# Patient Record
Sex: Female | Born: 1965 | Marital: Married | Smoking: Never smoker
Health system: Southern US, Community
[De-identification: ages and names within clinical notes are randomized; demographics above are authoritative.]

## PROBLEM LIST (undated history)

## (undated) DIAGNOSIS — M05742 Rheumatoid arthritis with rheumatoid factor of left hand without organ or systems involvement: Secondary | ICD-10-CM

## (undated) DIAGNOSIS — M1990 Unspecified osteoarthritis, unspecified site: Secondary | ICD-10-CM

## (undated) DIAGNOSIS — M05741 Rheumatoid arthritis with rheumatoid factor of right hand without organ or systems involvement: Secondary | ICD-10-CM

## (undated) DIAGNOSIS — E079 Disorder of thyroid, unspecified: Secondary | ICD-10-CM

## (undated) HISTORY — DX: Disorder of thyroid, unspecified: E07.9

## (undated) HISTORY — DX: Unspecified osteoarthritis, unspecified site: M19.90

## (undated) HISTORY — PX: NO PAST SURGERIES: SHX2092

## (undated) HISTORY — DX: Rheumatoid arthritis with rheumatoid factor of right hand without organ or systems involvement: M05.742

## (undated) HISTORY — DX: Rheumatoid arthritis with rheumatoid factor of right hand without organ or systems involvement: M05.741

---

## 2013-12-14 ENCOUNTER — Ambulatory Visit: Payer: Self-pay | Admitting: Family Medicine

## 2015-01-03 ENCOUNTER — Ambulatory Visit: Payer: Self-pay | Admitting: Family Medicine

## 2016-04-24 ENCOUNTER — Other Ambulatory Visit: Payer: Self-pay | Admitting: Internal Medicine

## 2016-04-24 DIAGNOSIS — Z1239 Encounter for other screening for malignant neoplasm of breast: Secondary | ICD-10-CM

## 2016-05-16 ENCOUNTER — Ambulatory Visit: Payer: Self-pay

## 2017-09-19 DIAGNOSIS — M25569 Pain in unspecified knee: Secondary | ICD-10-CM | POA: Insufficient documentation

## 2017-09-19 DIAGNOSIS — M25562 Pain in left knee: Secondary | ICD-10-CM | POA: Insufficient documentation

## 2017-12-09 ENCOUNTER — Other Ambulatory Visit: Payer: Self-pay | Admitting: Rheumatology

## 2017-12-09 DIAGNOSIS — M25561 Pain in right knee: Secondary | ICD-10-CM

## 2017-12-14 ENCOUNTER — Other Ambulatory Visit: Payer: Self-pay | Admitting: Rheumatology

## 2017-12-14 DIAGNOSIS — M25561 Pain in right knee: Principal | ICD-10-CM

## 2017-12-14 DIAGNOSIS — G8929 Other chronic pain: Secondary | ICD-10-CM

## 2017-12-16 ENCOUNTER — Ambulatory Visit: Payer: Self-pay

## 2017-12-16 ENCOUNTER — Other Ambulatory Visit: Payer: Self-pay

## 2017-12-23 ENCOUNTER — Ambulatory Visit
Admission: RE | Admit: 2017-12-23 | Discharge: 2017-12-23 | Disposition: A | Discharge status: Home / Self Care | Payer: Self-pay | Source: Ambulatory Visit | Attending: Rheumatology | Admitting: Rheumatology

## 2017-12-23 ENCOUNTER — Other Ambulatory Visit: Payer: Self-pay

## 2017-12-23 DIAGNOSIS — G8929 Other chronic pain: Secondary | ICD-10-CM

## 2017-12-23 DIAGNOSIS — M25561 Pain in right knee: Principal | ICD-10-CM

## 2018-01-06 ENCOUNTER — Other Ambulatory Visit
Admission: RE | Admit: 2018-01-06 | Discharge: 2018-01-06 | Disposition: A | Discharge status: Home / Self Care | Payer: BLUE CROSS/BLUE SHIELD | Source: Ambulatory Visit | Attending: Internal Medicine | Admitting: Internal Medicine

## 2018-01-06 DIAGNOSIS — M25561 Pain in right knee: Secondary | ICD-10-CM | POA: Diagnosis present

## 2018-01-06 DIAGNOSIS — M1711 Unilateral primary osteoarthritis, right knee: Secondary | ICD-10-CM | POA: Insufficient documentation

## 2018-01-06 LAB — SYNOVIAL CELL COUNT + DIFF, W/ CRYSTALS
Crystals, Fluid: NONE SEEN
EOSINOPHILS-SYNOVIAL: 0 %
Lymphocytes-Synovial Fld: 53 %
MONOCYTE-MACROPHAGE-SYNOVIAL FLUID: 45 %
Neutrophil, Synovial: 2 %
WBC, SYNOVIAL: 42 /mm3 (ref 0–200)

## 2018-01-10 LAB — BODY FLUID CULTURE
Culture: NO GROWTH
GRAM STAIN: NONE SEEN

## 2018-06-23 ENCOUNTER — Encounter (INDEPENDENT_AMBULATORY_CARE_PROVIDER_SITE_OTHER): Payer: Self-pay | Admitting: Vascular Surgery

## 2018-06-23 ENCOUNTER — Ambulatory Visit (INDEPENDENT_AMBULATORY_CARE_PROVIDER_SITE_OTHER): Payer: BLUE CROSS/BLUE SHIELD | Admitting: Vascular Surgery

## 2018-06-23 VITALS — BP 139/84 | HR 70 | Resp 17 | Ht 67.0 in | Wt 173.2 lb

## 2018-06-23 DIAGNOSIS — I83813 Varicose veins of bilateral lower extremities with pain: Secondary | ICD-10-CM

## 2018-06-23 DIAGNOSIS — R600 Localized edema: Secondary | ICD-10-CM | POA: Diagnosis not present

## 2018-06-23 NOTE — Progress Notes (Signed)
Subjective:    Patient ID: Melissa Baker, female    DOB: [birth year], 52 y.o.   MRN: [id number] Chief Complaint  Patient presents with  . New Patient (Initial Visit)    ref for varicose veins   Presents as a new patient referred by Dr. Jefm Bryant for evaluation of painful varicose veins.  The patient notes a long-standing history of painful varicosities located along the bilateral legs.  The patient notes that the discomfort along her varicosities worsen towards the end of the day or with sitting and standing for long periods of time.  The patient also experiences edema to the lower extremity.  This also worsens towards the end of the day with sitting standing for long periods of time.  At this time, the patient does not engage in conservative therapy including wearing medical grade 1 compression socks, elevating her legs and remaining active on a daily basis.  The patient feels that her symptoms have progressed to the point that she is unable to function on a daily basis and they have become lifestyle limiting.  The patient denies any recent surgery or trauma, DVT or recent bouts of cellulitis to the bilateral legs.  Patient denies any claudication-like symptoms, rest pain or ulcer formation to the bilateral lower extremity.  The patient denies any fever, nausea vomiting.  Review of Systems  Constitutional: Negative.   HENT: Negative.   Eyes: Negative.   Respiratory: Negative.   Cardiovascular: Positive for leg swelling.       Painful varicose veins  Gastrointestinal: Negative.   Endocrine: Negative.   Genitourinary: Negative.   Musculoskeletal: Negative.   Skin: Negative.   Allergic/Immunologic: Negative.   Neurological: Negative.   Hematological: Negative.   Psychiatric/Behavioral: Negative.       Objective:   Physical Exam  Constitutional: She is oriented to person, place, and time. She appears well-developed and well-nourished. No distress.  HENT:  Head: Normocephalic and  atraumatic.  Right Ear: External ear normal.  Left Ear: External ear normal.  Eyes: Pupils are equal, round, and reactive to light. Conjunctivae and EOM are normal.  Neck: Normal range of motion.  Cardiovascular: Normal rate, regular rhythm, normal heart sounds and intact distal pulses.  Pulses:      Radial pulses are 2+ on the right side, and 2+ on the left side.       Dorsalis pedis pulses are 2+ on the right side, and 2+ on the left side.       Posterior tibial pulses are 2+ on the right side, and 2+ on the left side.  Pulmonary/Chest: Effort normal and breath sounds normal.  Musculoskeletal: Normal range of motion. She exhibits edema (Mild 1+ pitting edema bilaterally).  Neurological: She is alert and oriented to person, place, and time.  Skin: She is not diaphoretic.  Less than 1 cm diffuse varicosities noted to the bilateral lower extremity.  There is no stasis dermatitis, fibrosis, cellulitis or active ulcerations noted at this time  Psychiatric: She has a normal mood and affect. Her behavior is normal. Judgment and thought content normal.  Vitals reviewed.  BP 139/84 (BP Location: Right Arm)   Pulse 70   Resp 17   Ht 5\' 7"  (1.702 m)   Wt 173 lb 3.2 oz (78.6 kg)   BMI 27.13 kg/m   Past Medical History:  Diagnosis Date  . Arthritis   . Thyroid disease    Social History   Socioeconomic History  . Marital status: Married  Spouse name: Not on file  . Number of children: Not on file  . Years of education: Not on file  . Highest education level: Not on file  Occupational History  . Not on file  Social Needs  . Financial resource strain: Not on file  . Food insecurity:    Worry: Not on file    Inability: Not on file  . Transportation needs:    Medical: Not on file    Non-medical: Not on file  Tobacco Use  . Smoking status: Never Smoker  . Smokeless tobacco: Never Used  Substance and Sexual Activity  . Alcohol use: Never    Frequency: Never  . Drug use: Never    . Sexual activity: Not on file  Lifestyle  . Physical activity:    Days per week: Not on file    Minutes per session: Not on file  . Stress: Not on file  Relationships  . Social connections:    Talks on phone: Not on file    Gets together: Not on file    Attends religious service: Not on file    Active member of club or organization: Not on file    Attends meetings of clubs or organizations: Not on file    Relationship status: Not on file  . Intimate partner violence:    Fear of current or ex partner: Not on file    Emotionally abused: Not on file    Physically abused: Not on file    Forced sexual activity: Not on file  Other Topics Concern  . Not on file  Social History Narrative  . Not on file   Family History  Problem Relation Age of Onset  . Varicose Veins Mother   . Heart failure Father       Assessment & Plan:  Presents as a new patient referred by Dr. Jefm Bryant for evaluation of painful varicose veins.  The patient notes a long-standing history of painful varicosities located along the bilateral legs.  The patient notes that the discomfort along her varicosities worsen towards the end of the day or with sitting and standing for long periods of time.  The patient also experiences edema to the lower extremity.  This also worsens towards the end of the day with sitting standing for long periods of time.  At this time, the patient does not engage in conservative therapy including wearing medical grade 1 compression socks, elevating her legs and remaining active on a daily basis.  The patient feels that her symptoms have progressed to the point that she is unable to function on a daily basis and they have become lifestyle limiting.  The patient denies any recent surgery or trauma, DVT or recent bouts of cellulitis to the bilateral legs.  Patient denies any claudication-like symptoms, rest pain or ulcer formation to the bilateral lower extremity.  The patient denies any fever, nausea  vomiting.  1. Bilateral lower extremity edema - New The patient was encouraged to wear graduated compression stockings (20-30 mmHg) on a daily basis. The patient was instructed to begin wearing the stockings first thing in the morning and removing them in the evening. The patient was instructed specifically not to sleep in the stockings. Prescription given.  In addition, behavioral modification including elevation during the day will be initiated. Anti-inflammatories for pain. I will bring the patient back in 3 months and have her undergo a bilateral lower extremity venous duplex to rule out any contributing venous versus lymphatic disease The  patient will follow up in three months to asses conservative management.  Information on chronic venous insufficiency and compression stockings was given to the patient. The patient was instructed to call the office in the interim if any worsening edema or ulcerations to the legs, feet or toes occurs. The patient expresses their understanding.  - VAS [place] LOWER EXTREMITY VENOUS REFLUX; Future  2. Varicose veins of both lower extremities with pain - New As above  - VAS [place] LOWER EXTREMITY VENOUS REFLUX; Future  Current Outpatient Medications on File Prior to Visit  Medication Sig Dispense Refill  . celecoxib (CELEBREX) 200 MG capsule Take by mouth.    . diclofenac sodium (VOLTAREN) 1 % GEL Apply topically.    Marland Kitchen levothyroxine (SYNTHROID, LEVOTHROID) 100 MCG tablet Take by mouth.    . methotrexate (RHEUMATREX) 2.5 MG tablet TAKE 8 TABLETS BY MOUTH EVERY 7 DAYS     No current facility-administered medications on file prior to visit.    There are no Patient Instructions on file for this visit. No follow-ups on file.  Mistina Coatney A Arliss Frisina, PA-C

## 2018-07-12 ENCOUNTER — Other Ambulatory Visit: Payer: Self-pay | Admitting: Internal Medicine

## 2018-07-12 DIAGNOSIS — Z1231 Encounter for screening mammogram for malignant neoplasm of breast: Secondary | ICD-10-CM

## 2018-09-23 ENCOUNTER — Encounter (INDEPENDENT_AMBULATORY_CARE_PROVIDER_SITE_OTHER): Payer: Self-pay | Admitting: Vascular Surgery

## 2018-09-23 ENCOUNTER — Ambulatory Visit (INDEPENDENT_AMBULATORY_CARE_PROVIDER_SITE_OTHER): Payer: BLUE CROSS/BLUE SHIELD | Admitting: Vascular Surgery

## 2018-09-23 ENCOUNTER — Ambulatory Visit (INDEPENDENT_AMBULATORY_CARE_PROVIDER_SITE_OTHER): Payer: BLUE CROSS/BLUE SHIELD

## 2018-09-23 VITALS — BP 137/89 | HR 60 | Resp 17 | Ht 67.0 in | Wt 173.0 lb

## 2018-09-23 DIAGNOSIS — I83813 Varicose veins of bilateral lower extremities with pain: Secondary | ICD-10-CM

## 2018-09-23 DIAGNOSIS — R600 Localized edema: Secondary | ICD-10-CM

## 2018-09-23 DIAGNOSIS — E039 Hypothyroidism, unspecified: Secondary | ICD-10-CM

## 2018-09-26 ENCOUNTER — Encounter (INDEPENDENT_AMBULATORY_CARE_PROVIDER_SITE_OTHER): Payer: Self-pay | Admitting: Vascular Surgery

## 2018-09-26 DIAGNOSIS — E039 Hypothyroidism, unspecified: Secondary | ICD-10-CM | POA: Insufficient documentation

## 2018-09-26 NOTE — Progress Notes (Signed)
MRN : [id number]  Melissa Baker is a 52 y.o. (06-28-1966) female who presents with chief complaint of  Chief Complaint  Patient presents with  . Follow-up    3 month bil venous reflux  .  History of Present Illness: The patient returns for followup evaluation 3 months after the initial visit. The patient continues to have pain in the lower extremities with dependency. The pain is lessened significantly with elevation. Graduated compression stockings, Class I (20-30 mmHg), have been worn and she notes the stockings almost completely eliminate the leg pain.  The degree of discomfort continues to interfere with daily activities. The patient notes the pain in the legs is causing problems with daily exercise, at the workplace and even with household activities and maintenance such as standing in the kitchen preparing meals and doing dishes.   Venous ultrasound shows normal deep venous system, no evidence of acute or chronic DVT.  Significant superficial reflux is not present.  Current Meds  Medication Sig  . celecoxib (CELEBREX) 200 MG capsule Take by mouth.  . diclofenac sodium (VOLTAREN) 1 % GEL Apply topically.  Marland Kitchen levothyroxine (SYNTHROID, LEVOTHROID) 100 MCG tablet Take by mouth.  . methotrexate (RHEUMATREX) 2.5 MG tablet TAKE 8 TABLETS BY MOUTH EVERY 7 DAYS    Past Medical History:  Diagnosis Date  . Arthritis   . Thyroid disease     Past Surgical History:  Procedure Laterality Date  . NO PAST SURGERIES      Social History Social History   Tobacco Use  . Smoking status: Never Smoker  . Smokeless tobacco: Never Used  Substance Use Topics  . Alcohol use: Never    Frequency: Never  . Drug use: Never    Family History Family History  Problem Relation Age of Onset  . Varicose Veins Mother   . Heart failure Father     No Known Allergies   REVIEW OF SYSTEMS (Negative unless checked)  Constitutional: [] Weight loss  [] Fever  [] Chills Cardiac: [] Chest pain    [] Chest pressure   [] Palpitations   [] Shortness of breath when laying flat   [] Shortness of breath with exertion. Vascular:  [] Pain in legs with walking   [x] Pain in legs at rest  [] History of DVT   [] Phlebitis   [x] Swelling in legs   [x] Varicose veins   [] Non-healing ulcers Pulmonary:   [] Uses home oxygen   [] Productive cough   [] Hemoptysis   [] Wheeze  [] COPD   [] Asthma Neurologic:  [] Dizziness   [] Seizures   [] History of stroke   [] History of TIA  [] Aphasia   [] Vissual changes   [] Weakness or numbness in arm   [] Weakness or numbness in leg Musculoskeletal:   [] Joint swelling   [] Joint pain   [] Low back pain Hematologic:  [] Easy bruising  [] Easy bleeding   [] Hypercoagulable state   [] Anemic Gastrointestinal:  [] Diarrhea   [] Vomiting  [] Gastroesophageal reflux/heartburn   [] Difficulty swallowing. Genitourinary:  [] Chronic kidney disease   [] Difficult urination  [] Frequent urination   [] Blood in urine Skin:  [] Rashes   [] Ulcers  Psychological:  [] History of anxiety   []  History of major depression.  Physical Examination  Vitals:   09/23/18 1450  BP: 137/89  Pulse: 60  Resp: 17  Weight: 173 lb (78.5 kg)  Height: 5\' 7"  (1.702 m)   Body mass index is 27.1 kg/m. Gen: WD/WN, NAD Head: [place]/AT, No temporalis wasting.  Ear/Nose/Throat: Hearing grossly intact, nares w/o erythema or drainage Eyes: PER, EOMI, sclera nonicteric.  Neck: Supple,  no large masses.   Pulmonary:  Good air movement, no audible wheezing bilaterally, no use of accessory muscles.  Cardiac: RRR, no JVD Vascular: scattered varicosities present bilaterally.  Mild venous stasis changes to the legs bilaterally.  2+ soft pitting edema Vessel Right Left  Radial Palpable Palpable  Gastrointestinal: Non-distended. No guarding/no peritoneal signs.  Musculoskeletal: M/S 5/5 throughout.  No deformity or atrophy.  Neurologic: CN 2-12 intact. Symmetrical.  Speech is fluent. Motor exam as listed above. Psychiatric: Judgment intact,  Mood & affect appropriate for pt's clinical situation. Dermatologic: very mild venous rashes no ulcers noted.  No changes consistent with cellulitis. Lymph : No lichenification or skin changes of chronic lymphedema.  CBC No results found for: WBC, HGB, HCT, MCV, PLT  BMET No results found for: NA, K, CL, CO2, GLUCOSE, BUN, CREATININE, CALCIUM, GFRNONAA, GFRAA CrCl cannot be calculated (No successful lab value found.).  COAG No results found for: INR, PROTIME  Radiology No results found.  Assessment/Plan 1. Varicose veins of both lower extremities with pain Recommend:  The patient is complaining of varicose veins.    I have had a long discussion with the patient regarding  varicose veins and why they cause symptoms.  Patient will begin wearing graduated compression stockings on a daily basis, beginning first thing in the morning and removing them in the evening. The patient is instructed specifically not to sleep in the stockings.    The patient  will also begin using over-the-counter analgesics such as Motrin 600 mg po TID to help control the symptoms as needed.    In addition, behavioral modification including elevation during the day will be initiated, utilizing a recliner was recommended.  The patient is also instructed to continue exercising such as walking 4-5 times per week.  At this time the patient wishes to continue conservative therapy and is not interested in more invasive treatments such as laser ablation and sclerotherapy.  The Patient will follow up PRN if the symptoms worsen.  2. Bilateral lower extremity edema No surgery or intervention at this point in time.    I have had a long discussion with the patient regarding venous insufficiency and why it  causes symptoms. I have discussed with the patient the chronic skin changes that accompany venous insufficiency and the long term sequela such as infection and ulceration.  Patient will begin wearing graduated  compression stockings class 1 (20-30 mmHg) or compression wraps on a daily basis a prescription was given. The patient will put the stockings on first thing in the morning and removing them in the evening. The patient is instructed specifically not to sleep in the stockings.    In addition, behavioral modification including several periods of elevation of the lower extremities during the day will be continued. I have demonstrated that proper elevation is a position with the ankles at heart level.  The patient is instructed to begin routine exercise, especially walking on a daily basis   3. Acquired hypothyroidism Continue Thyroid hormone replacement as already ordered, these medications have been reviewed and there are no changes at this time.     Hortencia Pilar, MD  09/26/2018 1:47 PM

## 2019-02-18 ENCOUNTER — Telehealth: Payer: Self-pay

## 2019-02-18 NOTE — Telephone Encounter (Signed)
Copied from [place] [id number]. Topic: General - Inquiry >> Feb 18, 2019  1:47 PM Richardo Priest, NT wrote: Reason for CRM: Patient's husband is calling is requesting that Santiago Glad call back. States she was helping them upload the insurance to their charts. Call back is [phone] or [phone].

## 2019-02-21 NOTE — Telephone Encounter (Addendum)
Pt husband returning Nina's call. Please advise. [phone]

## 2019-02-22 ENCOUNTER — Telehealth: Payer: Self-pay

## 2019-02-22 NOTE — Telephone Encounter (Signed)
Gave information to Santiago Glad who will handle the insurance.  Melissa Baker,cma

## 2019-02-22 NOTE — Telephone Encounter (Signed)
Copied from [place] [id number]. Topic: Appointment Scheduling - Scheduling Inquiry for Clinic >> Feb 22, 2019 12:15 PM Rainey Pines A wrote: Reason for CRM: Patient would like to establish primary care provider. Patient would like a callback.

## 2019-04-07 ENCOUNTER — Telehealth: Payer: Self-pay | Admitting: Internal Medicine

## 2019-04-07 ENCOUNTER — Other Ambulatory Visit: Payer: Self-pay

## 2019-04-07 NOTE — Telephone Encounter (Signed)
Do not recall accepting this new pateitn.  Please remind me of the circumstances.  Move appt if necessary

## 2019-04-07 NOTE — Telephone Encounter (Signed)
Called for an appt reminder for Monday. Pt husband stated that they only wanted an In office visit  Please advise pt

## 2019-04-07 NOTE — Telephone Encounter (Signed)
Pt is scheduled for 04/11/2019 at 4pm for a new pt appt. Pt is only wanting to be seen in the office. Caryl Pina has explained to the pt that we will have to move his appt to a different day and time because we do not do in office appts at 3:30 or 4. Pt is still wanting to be seen in the office only.

## 2019-04-07 NOTE — Telephone Encounter (Signed)
Dr. Derrel Nip did not agree to accept as a new patient. He will need to be scheduled with a provider that is accepting patients.

## 2019-04-08 NOTE — Telephone Encounter (Signed)
Lorriane Shire called and spoke to pt

## 2019-04-11 ENCOUNTER — Ambulatory Visit: Payer: PRIVATE HEALTH INSURANCE | Admitting: Internal Medicine

## 2019-04-11 ENCOUNTER — Encounter: Payer: Self-pay | Admitting: Internal Medicine

## 2019-04-11 ENCOUNTER — Other Ambulatory Visit: Payer: Self-pay

## 2019-04-11 ENCOUNTER — Ambulatory Visit (INDEPENDENT_AMBULATORY_CARE_PROVIDER_SITE_OTHER): Payer: PRIVATE HEALTH INSURANCE | Admitting: Internal Medicine

## 2019-04-11 VITALS — BP 120/78 | HR 79 | Temp 98.8°F | Resp 15 | Ht 67.0 in | Wt 168.8 lb

## 2019-04-11 DIAGNOSIS — Z1239 Encounter for other screening for malignant neoplasm of breast: Secondary | ICD-10-CM

## 2019-04-11 DIAGNOSIS — M25571 Pain in right ankle and joints of right foot: Secondary | ICD-10-CM | POA: Insufficient documentation

## 2019-04-11 DIAGNOSIS — R5383 Other fatigue: Secondary | ICD-10-CM

## 2019-04-11 DIAGNOSIS — M25532 Pain in left wrist: Secondary | ICD-10-CM | POA: Diagnosis not present

## 2019-04-11 DIAGNOSIS — G8929 Other chronic pain: Secondary | ICD-10-CM | POA: Insufficient documentation

## 2019-04-11 DIAGNOSIS — Z79899 Other long term (current) drug therapy: Secondary | ICD-10-CM

## 2019-04-11 DIAGNOSIS — E039 Hypothyroidism, unspecified: Secondary | ICD-10-CM

## 2019-04-11 NOTE — Assessment & Plan Note (Signed)
She has localized swelling and pain since a twisting event in March.  She had x rays done by Dr, Marry Guan to rule out fracture.  Lateral ankle sprain recommend use of a support sock,  Ice , and PT  exercises to increase mobility

## 2019-04-11 NOTE — Patient Instructions (Addendum)
Continue meloxicam total daily dose 15 mg   You can add up to 2000 mg of acetominophen (tylenol) every day safely  In divided doses (500 mg every 6 hours  Or 1000 mg every 12 hours.)  Neurology referral for CTS evaluation regarding the left wrist  Your right ankle strain should improve with time ,  Exercise and support of the ligaments when you are standing.  I will try to find the brand of cloth brace that I used on mine and sent you the information   Mammogram will be ordered   Check with your insurance about the cologuard test as a covered screening test for colon cancer.   I'll see you again in 3 months for your annual exam. Please make a separate fasitng labs appointment

## 2019-04-11 NOTE — Progress Notes (Signed)
Subjective:  Patient ID: Melissa Baker, female    DOB: 1966-03-24  Age: 53 y.o. MRN: [id number]  CC: There were no encounter diagnoses.  HPI Melissa Baker presents for new patient evaluation   She was referred by Dr Duayne Cal, whom she is seeing for right knee pain secondary to a  meniscal tear .   No surgery planned. Her knee pain is slowly improving using a knee brace  .  COVID 19 pandmeic impact explored.  Her ancillary staff has been restricted,  she feels  Overwhelmed by the domestic chores that aggravate her rheumatoid arthritis pain.  She takes MTX weekly and feels overtly fatigued the following  day ,sometimes sleeps the entire next day . She is primary caregiver to her mother , who has been trapped In [place] since February when she travelled there to visit patient's sister and was restricted travel due to the epidemic.    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 Her pain was noted to improve after 24 hours of use      Improving after a few days of wearing the brace .  Housewife,    Last PAP was 2015.   Last mammogram 2012   needs female tech and radiologist only!  Right lateral ankle pain and localized swelling since March when she had a twisting event    Has RA managed with MTX  History Melissa Baker has a past medical history of Arthritis and Thyroid disease.   She has a past surgical history that includes No past surgeries.   Her family history includes Heart failure in her father; Varicose Veins in her mother.She reports that she has never smoked. She has never used smokeless tobacco. She reports that she does not drink alcohol or use drugs.  Outpatient Medications Prior to Visit  Medication Sig Dispense Refill  . levothyroxine (SYNTHROID, LEVOTHROID) 100 MCG tablet Take by mouth.    . methotrexate (RHEUMATREX) 2.5 MG tablet TAKE 8 TABLETS BY MOUTH EVERY 7 DAYS    . celecoxib (CELEBREX) 200 MG capsule Take by mouth.     No facility-administered medications prior to visit.     Review of Systems:  Patient denies headache, fevers, malaise, unintentional weight loss, skin rash, eye pain, sinus congestion and sinus pain, sore throat, dysphagia,  hemoptysis , cough, dyspnea, wheezing, chest pain, palpitations, orthopnea, edema, abdominal pain, nausea, melena, diarrhea, constipation, flank pain, dysuria, hematuria, urinary  Frequency, nocturia, numbness, tingling, seizures,  Focal weakness, Loss of consciousness,  Tremor, insomnia,  and suicidal ideation.     Objective:  BP 120/78 (BP Location: Left Arm, Patient Position: Sitting, Cuff Size: Normal)   Pulse 79   Temp 98.8 F (37.1 C) (Oral)   Resp 15   Ht 5\' 7"  (1.702 m)   Wt 168 lb 12.8 oz (76.6 kg)   SpO2 98%   BMI 26.44 kg/m   Physical Exam:  General appearance: alert, cooperative and appears stated age Ears: normal TM's and external ear canals both ears Throat: lips, mucosa, and tongue normal; teeth and gums normal Neck: no adenopathy, no carotid bruit, supple, symmetrical, trachea midline and thyroid not enlarged, symmetric, no tenderness/mass/nodules Back: symmetric, no curvature. ROM normal. No CVA tenderness. Lungs: clear to auscultation bilaterally Heart: regular rate and rhythm, S1, S2 normal, no murmur, click, rub or gallop Abdomen: soft, non-tender; bowel sounds normal; no masses,  no organomegaly Pulses: 2+ and symmetric Skin: Skin color,  texture, turgor normal. No rashes or lesions Lymph nodes: Cervical, supraclavicular, and axillary nodes normal.  Assessment & Plan:   Problem List Items Addressed This Visit    None      I have discontinued Melissa Baker's celecoxib. I am also having her maintain her levothyroxine and methotrexate.  No orders of the defined types were placed in this encounter.   Medications Discontinued During This Encounter  Medication Reason  . celecoxib (CELEBREX) 200 MG capsule Error    Follow-up: No  follow-ups on file.   Crecencio Mc, MD

## 2019-04-11 NOTE — Assessment & Plan Note (Addendum)
Pain and numbness improving with use of ankle brace. 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  Referring to neurology for EMG/Racine studies

## 2019-04-15 DIAGNOSIS — M25561 Pain in right knee: Secondary | ICD-10-CM

## 2019-04-26 ENCOUNTER — Encounter: Payer: Self-pay | Admitting: Internal Medicine

## 2019-05-10 ENCOUNTER — Telehealth: Payer: Self-pay | Admitting: Internal Medicine

## 2019-05-10 NOTE — Telephone Encounter (Signed)
Is it okay to schedule pt for a knee injection?

## 2019-05-10 NOTE — Telephone Encounter (Signed)
It is too soon .Marland Kitchen  She had one on  April 23 at [facility],  And is under the care of Dr Marry Guan .

## 2019-05-10 NOTE — Telephone Encounter (Signed)
Pt stated that she is having knee pain and is wanting to get an injection ASAP

## 2019-05-11 NOTE — Telephone Encounter (Signed)
Spoke with Melissa Baker and she stated that she does not want Dr. Marry Guan to give the injections any more. Melissa Baker stated that she spoke with you at her last visit and that you had agreed to start giving them to her. Melissa Baker wanted me to go ahead and schedule an appt but for another knee injection but I told the Melissa Baker that I could not right now because im not sure when she would be due for the next one and that I would need to follow up with but this.

## 2019-05-11 NOTE — Telephone Encounter (Signed)
[facility] docu,ented April 23.  So 4 month miniumum

## 2019-05-11 NOTE — Telephone Encounter (Signed)
Pt scheduled for 06/21/19

## 2019-05-11 NOTE — Telephone Encounter (Signed)
Patient returning [place] call. Says she thinks her last injection was in March and would like a call back to discuss.

## 2019-05-11 NOTE — Telephone Encounter (Signed)
LMTCB. Need to schedule pt for a knee injection sometime after 06/12/2019.

## 2019-06-21 ENCOUNTER — Ambulatory Visit: Payer: PRIVATE HEALTH INSURANCE | Admitting: Internal Medicine

## 2019-07-11 ENCOUNTER — Other Ambulatory Visit: Payer: Self-pay

## 2019-07-11 ENCOUNTER — Other Ambulatory Visit (INDEPENDENT_AMBULATORY_CARE_PROVIDER_SITE_OTHER): Payer: PRIVATE HEALTH INSURANCE

## 2019-07-11 DIAGNOSIS — E039 Hypothyroidism, unspecified: Secondary | ICD-10-CM

## 2019-07-11 DIAGNOSIS — Z79899 Other long term (current) drug therapy: Secondary | ICD-10-CM | POA: Diagnosis not present

## 2019-07-11 DIAGNOSIS — R5383 Other fatigue: Secondary | ICD-10-CM | POA: Diagnosis not present

## 2019-07-11 LAB — CBC WITH DIFFERENTIAL/PLATELET
Basophils Absolute: 0 10*3/uL (ref 0.0–0.1)
Basophils Relative: 0.4 % (ref 0.0–3.0)
Eosinophils Absolute: 0.1 10*3/uL (ref 0.0–0.7)
Eosinophils Relative: 0.9 % (ref 0.0–5.0)
HCT: 38.8 % (ref 36.0–46.0)
Hemoglobin: 12.8 g/dL (ref 12.0–15.0)
Lymphocytes Relative: 24.3 % (ref 12.0–46.0)
Lymphs Abs: 2 10*3/uL (ref 0.7–4.0)
MCHC: 33 g/dL (ref 30.0–36.0)
MCV: 86.2 fl (ref 78.0–100.0)
Monocytes Absolute: 0.4 10*3/uL (ref 0.1–1.0)
Monocytes Relative: 4.9 % (ref 3.0–12.0)
Neutro Abs: 5.7 10*3/uL (ref 1.4–7.7)
Neutrophils Relative %: 69.5 % (ref 43.0–77.0)
Platelets: 309 10*3/uL (ref 150.0–400.0)
RBC: 4.5 Mil/uL (ref 3.87–5.11)
RDW: 17.1 % — ABNORMAL HIGH (ref 11.5–15.5)
WBC: 8.2 10*3/uL (ref 4.0–10.5)

## 2019-07-11 LAB — COMPREHENSIVE METABOLIC PANEL
ALT: 14 U/L (ref 0–35)
AST: 15 U/L (ref 0–37)
Albumin: 4.2 g/dL (ref 3.5–5.2)
Alkaline Phosphatase: 72 U/L (ref 39–117)
BUN: 10 mg/dL (ref 6–23)
CO2: 26 mEq/L (ref 19–32)
Calcium: 9.7 mg/dL (ref 8.4–10.5)
Chloride: 102 mEq/L (ref 96–112)
Creatinine, Ser: 0.52 mg/dL (ref 0.40–1.20)
GFR: 122.37 mL/min (ref >60.00)
Glucose, Bld: 80 mg/dL (ref 70–99)
Potassium: 4.2 mEq/L (ref 3.5–5.1)
Sodium: 138 mEq/L (ref 135–145)
Total Bilirubin: 0.4 mg/dL (ref 0.2–1.2)
Total Protein: 6.9 g/dL (ref 6.0–8.3)

## 2019-07-11 LAB — LIPID PANEL
Cholesterol: 235 mg/dL — ABNORMAL HIGH (ref 0–200)
HDL: 50 mg/dL (ref >39.00)
LDL Cholesterol: 156 mg/dL — ABNORMAL HIGH (ref 0–99)
NonHDL: 185.09
Total CHOL/HDL Ratio: 5
Triglycerides: 143 mg/dL (ref 0.0–149.0)
VLDL: 28.6 mg/dL (ref 0.0–40.0)

## 2019-07-11 LAB — TSH: TSH: 2.03 u[IU]/mL (ref 0.35–4.50)

## 2019-07-13 ENCOUNTER — Ambulatory Visit (INDEPENDENT_AMBULATORY_CARE_PROVIDER_SITE_OTHER): Payer: PRIVATE HEALTH INSURANCE | Admitting: Internal Medicine

## 2019-07-13 ENCOUNTER — Other Ambulatory Visit: Payer: Self-pay

## 2019-07-13 ENCOUNTER — Other Ambulatory Visit (HOSPITAL_COMMUNITY)
Admission: RE | Admit: 2019-07-13 | Discharge: 2019-07-13 | Disposition: A | Discharge status: Home / Self Care | Payer: PRIVATE HEALTH INSURANCE | Source: Ambulatory Visit | Attending: Internal Medicine | Admitting: Internal Medicine

## 2019-07-13 ENCOUNTER — Encounter: Payer: Self-pay | Admitting: Internal Medicine

## 2019-07-13 VITALS — BP 118/72 | HR 75 | Temp 98.5°F | Resp 16 | Ht 67.0 in | Wt 169.0 lb

## 2019-07-13 DIAGNOSIS — G5603 Carpal tunnel syndrome, bilateral upper limbs: Secondary | ICD-10-CM

## 2019-07-13 DIAGNOSIS — M0579 Rheumatoid arthritis with rheumatoid factor of multiple sites without organ or systems involvement: Secondary | ICD-10-CM | POA: Diagnosis not present

## 2019-07-13 DIAGNOSIS — M069 Rheumatoid arthritis, unspecified: Secondary | ICD-10-CM | POA: Insufficient documentation

## 2019-07-13 DIAGNOSIS — Z1231 Encounter for screening mammogram for malignant neoplasm of breast: Secondary | ICD-10-CM

## 2019-07-13 DIAGNOSIS — Z0000 Encounter for general adult medical examination without abnormal findings: Secondary | ICD-10-CM | POA: Diagnosis not present

## 2019-07-13 DIAGNOSIS — Z124 Encounter for screening for malignant neoplasm of cervix: Secondary | ICD-10-CM | POA: Diagnosis not present

## 2019-07-13 DIAGNOSIS — Z23 Encounter for immunization: Secondary | ICD-10-CM | POA: Diagnosis not present

## 2019-07-13 DIAGNOSIS — E785 Hyperlipidemia, unspecified: Secondary | ICD-10-CM | POA: Diagnosis not present

## 2019-07-13 NOTE — Progress Notes (Signed)
Patient ID: Melissa Baker, female    DOB: June 13, 1966  Age: 53 y.o. MRN: [id number]  The patient is here for annual preventive examination and management of other chronic and acute problems.   The risk factors are reflected in the social history.  The roster of all physicians providing medical care to patient - is listed in the Snapshot section of the chart.  Activities of daily living:  The patient is 100% independent in all ADLs: dressing, toileting, feeding as well as independent mobility  Home safety : The patient has smoke detectors in the home. They wear seatbelts.  There are no firearms at home. There is no violence in the home.   There is no risks for hepatitis, STDs or HIV. There is no   history of blood transfusion. They have no travel history to infectious disease endemic areas of the world.  The patient has seen their dentist in the last six month. They have seen their eye doctor in the last year. .  They do not  have excessive sun exposure. Discussed the need for sun protection: hats, long sleeves and use of sunscreen if there is significant sun exposure.   Diet: the importance of a healthy diet is discussed. They do have a healthy diet.  The benefits of regular aerobic exercise were discussed. She has been unable to exercise due to ankle and knee pain from rheumatoid arthritis   Depression screen: there are no signs or vegative symptoms of depression- irritability, change in appetite, anhedonia, sadness/tearfullness.  The following portions of the patient's history were reviewed and updated as appropriate: allergies, current medications, past family history, past medical history,  past surgical history, past social history  and problem list.  Visual acuity was not assessed per patient preference since she has regular follow up with her ophthalmologist. Hearing and body mass index were assessed and reviewed.   During the course of the visit the patient was educated and counseled  about appropriate screening and preventive services including : fall prevention , diabetes screening, nutrition counseling, colorectal cancer screening, and recommended immunizations.    CC: The primary encounter diagnosis was Cervical cancer screening. Diagnoses of Need for immunization against influenza, Encounter for preventive health examination, Encounter for Papanicolaou smear for cervical cancer screening, Encounter for screening mammogram for breast cancer, Hyperlipidemia LDL goal <130, Carpal tunnel syndrome on both sides, and Rheumatoid arthritis involving multiple sites with positive rheumatoid factor ([place]) were also pertinent to this visit.  Since her initial visit in June she has seen Dr Jefm Bryant (Rheumatology) for knee pain and Dr Manuella Ghazi (Neurology)for bilateral hand pain   Her right knee effusion  was aspirated by Dr. Jefm Bryant ion August 13.  approx 30 ccs was removed and [place] 40 mg injected with good results .  A prednisone taper was prescribed and MTC was increased.  At 6 week follow up  She was advised to consider Humira for active inflammation  And advised to defer the carpal tunnel injection advised by Dr Manuella Ghazi (see below) .  She is asking for my advice   She was diagnosed with  CTS , given gabapentin and scheduled for a carpal tunnel injection in October   History Schuyler has a past medical history of Rheumatoid arthritis involving both hands with positive rheumatoid factor ([place]) and Thyroid disease.   She has a past surgical history that includes No past surgeries.   Her family history includes Arthritis in her mother; Heart failure in her father; Varicose Veins in her  mother.She reports that she has never smoked. She has never used smokeless tobacco. She reports that she does not drink alcohol or use drugs.  Outpatient Medications Prior to Visit  Medication Sig Dispense Refill  . folic acid (FOLVITE) 1 MG tablet Take 2 mg by mouth daily.    Marland Kitchen levothyroxine (SYNTHROID,  LEVOTHROID) 100 MCG tablet Take by mouth.    . meloxicam (MOBIC) 7.5 MG tablet Take 7.5 mg by mouth daily.    . methotrexate (RHEUMATREX) 2.5 MG tablet TAKE 8 TABLETS BY MOUTH EVERY 7 DAYS     No facility-administered medications prior to visit.     Review of Systems  Patient denies headache, fevers, malaise, unintentional weight loss, skin rash, eye pain, sinus congestion and sinus pain, sore throat, dysphagia,  hemoptysis , cough, dyspnea, wheezing, chest pain, palpitations, orthopnea, edema, abdominal pain, nausea, melena, diarrhea, constipation, flank pain, dysuria, hematuria, urinary  Frequency, nocturia, numbness, tingling, seizures,  Focal weakness, Loss of consciousness,  Tremor, insomnia, depression, anxiety, and suicidal ideation.     Objective:  BP 118/72   Pulse 75   Temp 98.5 F (36.9 C)   Resp 16   Ht '5\' 7"'$  (1.702 m)   Wt 169 lb (76.7 kg)   SpO2 98%   BMI 26.47 kg/m   Physical Exam   . General Appearance:    Alert, cooperative, no distress, appears stated age  Head:    Normocephalic, without obvious abnormality, atraumatic  Eyes:    PERRL, conjunctiva/corneas clear, EOM's intact, fundi    benign, both eyes  Ears:    Normal TM's and external ear canals, both ears  Nose:   Nares normal, septum midline, mucosa normal, no drainage    or sinus tenderness  Throat:   Lips, mucosa, and tongue normal; teeth and gums normal  Neck:   Supple, symmetrical, trachea midline, no adenopathy;    thyroid:  no enlargement/tenderness/nodules; no carotid   bruit or JVD  Back:     Symmetric, no curvature, ROM normal, no CVA tenderness  Lungs:     Clear to auscultation bilaterally, respirations unlabored  Chest Wall:    No tenderness or deformity   Heart:    Regular rate and rhythm, S1 and S2 normal, no murmur, rub   or gallop  Breast Exam:    No tenderness, masses, or nipple abnormality  Abdomen:     Soft, non-tender, bowel sounds active all four quadrants,    no masses, no  organomegaly  Genitalia:    Pelvic: cervix normal in appearance, external genitalia normal, no adnexal masses or tenderness, no cervical motion tenderness, rectovaginal septum normal, uterus normal size, shape, and consistency and vagina normal without discharge  Extremities:   Extremities normal, atraumatic, no cyanosis or edema  Pulses:   2+ and symmetric all extremities  Skin:   Skin color, texture, turgor normal, no rashes or lesions  Lymph nodes:   Cervical, supraclavicular, and axillary nodes normal  Neurologic:   CNII-XII intact, normal strength, sensation and reflexes    throughout      Assessment & Plan:   Problem List Items Addressed This Visit      Unprioritized   Encounter for preventive health examination    age appropriate education and counseling updated, referrals for preventative services and immunizations addressed, dietary and smoking counseling addressed, most recent labs reviewed.  I have personally reviewed and have noted:  1) the patient's medical and social history 2) The pt's use of alcohol, tobacco, and illicit  drugs 3) The patient's current medications and supplements 4) Functional ability including ADL's, fall risk, home safety risk, hearing and visual impairment 5) Diet and physical activities 6) Evidence for depression or mood disorder 7) The patient's height, weight, and BMI have been recorded in the chart  I have made referrals, and provided counseling and education based on review of the above      Encounter for Papanicolaou smear for cervical cancer screening   Encounter for screening mammogram for breast cancer    Breast exam done, mammogram has been ordered       Hyperlipidemia LDL goal <130    Using the Framingham risk calculator,  her 10 year risk of coronary artery disease is 5%.      Carpal tunnel syndrome on both sides    Agree with Dr Jefm Bryant re postponing CT injections until after humira has been added       Rheumatoid  arthritis ([place])    Persistent ESR elevation and synovitis on MTX with recent dose increased by Rheum.  Agree with Humira trial.  Hi dose flu vaccine given today        Other Visit Diagnoses    Cervical cancer screening    -  Primary   Relevant Orders   Cytology - PAP( [place])   Need for immunization against influenza       Relevant Orders   Flu Vaccine QUAD High Dose(Fluad) (Completed)      I am having Tonae Showers maintain her levothyroxine, methotrexate, folic acid, and meloxicam.  No orders of the defined types were placed in this encounter.   There are no discontinued medications.  Follow-up: No follow-ups on file.   Crecencio Mc, MD

## 2019-07-13 NOTE — Assessment & Plan Note (Signed)
Persistent ESR elevation and synovitis on MTX with recent dose increased by Rheum.  Agree with Humira trial.  Hi dose flu vaccine given today

## 2019-07-13 NOTE — Assessment & Plan Note (Signed)

## 2019-07-13 NOTE — Assessment & Plan Note (Signed)
Agree with Dr Jefm Bryant re postponing CT injections until after humira has been added

## 2019-07-13 NOTE — Assessment & Plan Note (Signed)
Using the Framingham risk calculator,  her 10 year risk of coronary artery disease is 5%.

## 2019-07-13 NOTE — Assessment & Plan Note (Signed)
Breast exam done, mammogram has been ordered

## 2019-07-13 NOTE — Patient Instructions (Signed)
I agree totally with Dr Jefm Bryant.  Defer the steroid injection  You received the high dose  flu vaccine today   Health Maintenance for Postmenopausal Women Menopause is a normal process in which your ability to get pregnant comes to an end. This process happens slowly over many months or years, usually between the ages of 69 and 109. Menopause is complete when you have missed your menstrual periods for 12 months. It is important to talk with your health care provider about some of the most common conditions that affect women after menopause (postmenopausal women). These include heart disease, cancer, and bone loss (osteoporosis). Adopting a healthy lifestyle and getting preventive care can help to promote your health and wellness. The actions you take can also lower your chances of developing some of these common conditions. What should I know about menopause? During menopause, you may get a number of symptoms, such as:  Hot flashes. These can be moderate or severe.  Night sweats.  Decrease in sex drive.  Mood swings.  Headaches.  Tiredness.  Irritability.  Memory problems.  Insomnia. Choosing to treat or not to treat these symptoms is a decision that you make with your health care provider. Do I need hormone replacement therapy?  Hormone replacement therapy is effective in treating symptoms that are caused by menopause, such as hot flashes and night sweats.  Hormone replacement carries certain risks, especially as you become older. If you are thinking about using estrogen or estrogen with progestin, discuss the benefits and risks with your health care provider. What is my risk for heart disease and stroke? The risk of heart disease, heart attack, and stroke increases as you age. One of the causes may be a change in the body's hormones during menopause. This can affect how your body uses dietary fats, triglycerides, and cholesterol. Heart attack and stroke are medical emergencies.  There are many things that you can do to help prevent heart disease and stroke. Watch your blood pressure  High blood pressure causes heart disease and increases the risk of stroke. This is more likely to develop in people who have high blood pressure readings, are of African descent, or are overweight.  Have your blood pressure checked: ? Every 3-5 years if you are 7-20 years of age. ? Every year if you are 38 years old or older. Eat a healthy diet   Eat a diet that includes plenty of vegetables, fruits, low-fat dairy products, and lean protein.  Do not eat a lot of foods that are high in solid fats, added sugars, or sodium. Get regular exercise Get regular exercise. This is one of the most important things you can do for your health. Most adults should:  Try to exercise for at least 150 minutes each week. The exercise should increase your heart rate and make you sweat (moderate-intensity exercise).  Try to do strengthening exercises at least twice each week. Do these in addition to the moderate-intensity exercise.  Spend less time sitting. Even light physical activity can be beneficial. Other tips  Work with your health care provider to achieve or maintain a healthy weight.  Do not use any products that contain nicotine or tobacco, such as cigarettes, e-cigarettes, and chewing tobacco. If you need help quitting, ask your health care provider.  Know your numbers. Ask your health care provider to check your cholesterol and your blood sugar (glucose). Continue to have your blood tested as directed by your health care provider. Do I need screening for  cancer? Depending on your health history and family history, you may need to have cancer screening at different stages of your life. This may include screening for:  Breast cancer.  Cervical cancer.  Lung cancer.  Colorectal cancer. What is my risk for osteoporosis? After menopause, you may be at increased risk for osteoporosis.  Osteoporosis is a condition in which bone destruction happens more quickly than new bone creation. To help prevent osteoporosis or the bone fractures that can happen because of osteoporosis, you may take the following actions:  If you are 34-68 years old, get at least 1,000 mg of calcium and at least 600 mg of vitamin D per day.  If you are older than age 24 but younger than age 44, get at least 1,200 mg of calcium and at least 600 mg of vitamin D per day.  If you are older than age 28, get at least 1,200 mg of calcium and at least 800 mg of vitamin D per day. Smoking and drinking excessive alcohol increase the risk of osteoporosis. Eat foods that are rich in calcium and vitamin D, and do weight-bearing exercises several times each week as directed by your health care provider. How does menopause affect my mental health? Depression may occur at any age, but it is more common as you become older. Common symptoms of depression include:  Low or sad mood.  Changes in sleep patterns.  Changes in appetite or eating patterns.  Feeling an overall lack of motivation or enjoyment of activities that you previously enjoyed.  Frequent crying spells. Talk with your health care provider if you think that you are experiencing depression. General instructions See your health care provider for regular wellness exams and vaccines. This may include:  Scheduling regular health, dental, and eye exams.  Getting and maintaining your vaccines. These include: ? Influenza vaccine. Get this vaccine each year before the flu season begins. ? Pneumonia vaccine. ? Shingles vaccine. ? Tetanus, diphtheria, and pertussis (Tdap) booster vaccine. Your health care provider may also recommend other immunizations. Tell your health care provider if you have ever been abused or do not feel safe at home. Summary  Menopause is a normal process in which your ability to get pregnant comes to an end.  This condition causes  hot flashes, night sweats, decreased interest in sex, mood swings, headaches, or lack of sleep.  Treatment for this condition may include hormone replacement therapy.  Take actions to keep yourself healthy, including exercising regularly, eating a healthy diet, watching your weight, and checking your blood pressure and blood sugar levels.  Get screened for cancer and depression. Make sure that you are up to date with all your vaccines. This information is not intended to replace advice given to you by your health care provider. Make sure you discuss any questions you have with your health care provider. Document Released: 11/28/2005 Document Revised: 09/29/2018 Document Reviewed: 09/29/2018 Elsevier Patient Education  2020 [employer].

## 2019-07-15 LAB — CYTOLOGY - PAP
Diagnosis: NEGATIVE
High risk HPV: NEGATIVE
Molecular Disclaimer: 56
Molecular Disclaimer: NORMAL

## 2020-01-18 ENCOUNTER — Other Ambulatory Visit: Payer: Self-pay | Admitting: Rheumatology

## 2020-01-18 DIAGNOSIS — G8929 Other chronic pain: Secondary | ICD-10-CM

## 2020-01-18 DIAGNOSIS — M25571 Pain in right ankle and joints of right foot: Secondary | ICD-10-CM

## 2020-01-26 ENCOUNTER — Ambulatory Visit
Admission: RE | Admit: 2020-01-26 | Discharge: 2020-01-26 | Disposition: A | Discharge status: Home / Self Care | Payer: PRIVATE HEALTH INSURANCE | Source: Ambulatory Visit | Attending: Rheumatology | Admitting: Rheumatology

## 2020-01-26 ENCOUNTER — Other Ambulatory Visit: Payer: Self-pay

## 2020-01-26 DIAGNOSIS — G8929 Other chronic pain: Secondary | ICD-10-CM | POA: Diagnosis present

## 2020-01-26 DIAGNOSIS — M25571 Pain in right ankle and joints of right foot: Secondary | ICD-10-CM | POA: Diagnosis not present

## 2020-03-27 ENCOUNTER — Telehealth: Payer: Self-pay | Admitting: Internal Medicine

## 2020-03-27 MED ORDER — LEVOTHYROXINE SODIUM 100 MCG PO TABS: 100.0000 ug | ORAL_TABLET | Freq: Every day | ORAL | 1 refills | Status: DC

## 2020-03-27 NOTE — Telephone Encounter (Signed)
Pt needs a refill on levothyroxine (SYNTHROID, LEVOTHROID) 100 MCG tablet sent to [facility]

## 2020-07-02 ENCOUNTER — Telehealth: Payer: Self-pay | Admitting: Internal Medicine

## 2020-07-02 DIAGNOSIS — Z0000 Encounter for general adult medical examination without abnormal findings: Secondary | ICD-10-CM

## 2020-07-02 DIAGNOSIS — E785 Hyperlipidemia, unspecified: Secondary | ICD-10-CM

## 2020-07-02 DIAGNOSIS — E039 Hypothyroidism, unspecified: Secondary | ICD-10-CM

## 2020-07-02 NOTE — Telephone Encounter (Signed)
Patient called in wanted to know if she needs to get labs before her physical scheduled on 07-23-20

## 2020-07-02 NOTE — Telephone Encounter (Signed)
Pt would like to have her labs done before her appt on 07/23/2020. I have ordered cbc, lipid panel, cmp, tsh, and a1c. Is there anything else that needs to be ordered?

## 2020-07-16 ENCOUNTER — Encounter: Payer: PRIVATE HEALTH INSURANCE | Admitting: Internal Medicine

## 2020-07-20 ENCOUNTER — Other Ambulatory Visit: Payer: Self-pay

## 2020-07-20 ENCOUNTER — Other Ambulatory Visit (INDEPENDENT_AMBULATORY_CARE_PROVIDER_SITE_OTHER): Payer: PRIVATE HEALTH INSURANCE

## 2020-07-20 DIAGNOSIS — Z0000 Encounter for general adult medical examination without abnormal findings: Secondary | ICD-10-CM | POA: Diagnosis not present

## 2020-07-20 DIAGNOSIS — E039 Hypothyroidism, unspecified: Secondary | ICD-10-CM | POA: Diagnosis not present

## 2020-07-20 DIAGNOSIS — E785 Hyperlipidemia, unspecified: Secondary | ICD-10-CM | POA: Diagnosis not present

## 2020-07-20 LAB — CBC WITH DIFFERENTIAL/PLATELET
Basophils Absolute: 0 10*3/uL (ref 0.0–0.1)
Basophils Relative: 0.8 % (ref 0.0–3.0)
Eosinophils Absolute: 0.1 10*3/uL (ref 0.0–0.7)
Eosinophils Relative: 2 % (ref 0.0–5.0)
HCT: 38.6 % (ref 36.0–46.0)
Hemoglobin: 13.1 g/dL (ref 12.0–15.0)
Lymphocytes Relative: 44.5 % (ref 12.0–46.0)
Lymphs Abs: 2.2 10*3/uL (ref 0.7–4.0)
MCHC: 33.8 g/dL (ref 30.0–36.0)
MCV: 90.3 fl (ref 78.0–100.0)
Monocytes Absolute: 0.3 10*3/uL (ref 0.1–1.0)
Monocytes Relative: 5.1 % (ref 3.0–12.0)
Neutro Abs: 2.3 10*3/uL (ref 1.4–7.7)
Neutrophils Relative %: 47.6 % (ref 43.0–77.0)
Platelets: 249 10*3/uL (ref 150.0–400.0)
RBC: 4.28 Mil/uL (ref 3.87–5.11)
RDW: 15.3 % (ref 11.5–15.5)
WBC: 4.9 10*3/uL (ref 4.0–10.5)

## 2020-07-20 LAB — COMPREHENSIVE METABOLIC PANEL
ALT: 15 U/L (ref 0–35)
AST: 14 U/L (ref 0–37)
Albumin: 4.4 g/dL (ref 3.5–5.2)
Alkaline Phosphatase: 52 U/L (ref 39–117)
BUN: 16 mg/dL (ref 6–23)
CO2: 28 mEq/L (ref 19–32)
Calcium: 9.6 mg/dL (ref 8.4–10.5)
Chloride: 105 mEq/L (ref 96–112)
Creatinine, Ser: 0.7 mg/dL (ref 0.40–1.20)
GFR: 86.51 mL/min (ref >60.00)
Glucose, Bld: 96 mg/dL (ref 70–99)
Potassium: 4.1 mEq/L (ref 3.5–5.1)
Sodium: 140 mEq/L (ref 135–145)
Total Bilirubin: 0.5 mg/dL (ref 0.2–1.2)
Total Protein: 6.7 g/dL (ref 6.0–8.3)

## 2020-07-20 LAB — LIPID PANEL
Cholesterol: 242 mg/dL — ABNORMAL HIGH (ref 0–200)
HDL: 50.8 mg/dL (ref >39.00)
LDL Cholesterol: 156 mg/dL — ABNORMAL HIGH (ref 0–99)
NonHDL: 191.32
Total CHOL/HDL Ratio: 5
Triglycerides: 177 mg/dL — ABNORMAL HIGH (ref 0.0–149.0)
VLDL: 35.4 mg/dL (ref 0.0–40.0)

## 2020-07-20 LAB — HEMOGLOBIN A1C: Hgb A1c MFr Bld: 5.5 % (ref 4.6–6.5)

## 2020-07-20 LAB — TSH: TSH: 1.11 u[IU]/mL (ref 0.35–4.50)

## 2020-07-23 ENCOUNTER — Ambulatory Visit (INDEPENDENT_AMBULATORY_CARE_PROVIDER_SITE_OTHER): Payer: PRIVATE HEALTH INSURANCE | Admitting: Internal Medicine

## 2020-07-23 ENCOUNTER — Telehealth: Payer: Self-pay | Admitting: Internal Medicine

## 2020-07-23 ENCOUNTER — Encounter: Payer: Self-pay | Admitting: Internal Medicine

## 2020-07-23 ENCOUNTER — Other Ambulatory Visit: Payer: Self-pay

## 2020-07-23 VITALS — BP 138/74 | HR 84 | Temp 98.2°F | Resp 15 | Ht 67.0 in | Wt 169.8 lb

## 2020-07-23 DIAGNOSIS — Z0000 Encounter for general adult medical examination without abnormal findings: Secondary | ICD-10-CM | POA: Diagnosis not present

## 2020-07-23 DIAGNOSIS — Z23 Encounter for immunization: Secondary | ICD-10-CM | POA: Diagnosis not present

## 2020-07-23 DIAGNOSIS — Z79899 Other long term (current) drug therapy: Secondary | ICD-10-CM

## 2020-07-23 DIAGNOSIS — Z1211 Encounter for screening for malignant neoplasm of colon: Secondary | ICD-10-CM | POA: Diagnosis not present

## 2020-07-23 DIAGNOSIS — E785 Hyperlipidemia, unspecified: Secondary | ICD-10-CM

## 2020-07-23 DIAGNOSIS — R5383 Other fatigue: Secondary | ICD-10-CM

## 2020-07-23 DIAGNOSIS — Z1231 Encounter for screening mammogram for malignant neoplasm of breast: Secondary | ICD-10-CM

## 2020-07-23 DIAGNOSIS — R7301 Impaired fasting glucose: Secondary | ICD-10-CM

## 2020-07-23 DIAGNOSIS — M0579 Rheumatoid arthritis with rheumatoid factor of multiple sites without organ or systems involvement: Secondary | ICD-10-CM

## 2020-07-23 DIAGNOSIS — E039 Hypothyroidism, unspecified: Secondary | ICD-10-CM

## 2020-07-23 NOTE — Assessment & Plan Note (Addendum)
Current risk is 8.5%  But has RA  Discussed risks s benefits of statin . Father died of MI at 41 .

## 2020-07-23 NOTE — Progress Notes (Signed)
Patient ID: Melissa Baker, female    DOB: [birth year]  Age: 54 y.o. MRN: [id number]  The patient is here for annual wellness examination and management of other chronic and acute problems.  This visit occurred during the SARS-CoV-2 public health emergency.  Safety protocols were in place, including screening questions prior to the visit, additional usage of staff PPE, and extensive cleaning of exam room while observing appropriate contact time as indicated for disinfecting solutions.     The risk factors are reflected in the social history.  The roster of all physicians providing medical care to patient - is listed in the Snapshot section of the chart.  Activities of daily living:  The patient is 100% independent in all ADLs: dressing, toileting, feeding as well as independent mobility  Home safety : The patient has smoke detectors in the home. They wear seatbelts.  There are no firearms at home. There is no violence in the home.   There is no risks for hepatitis, STDs or HIV. There is no   history of blood transfusion. They have no travel history to infectious disease endemic areas of the world.  The patient has seen their dentist in the last six month. They have seen their eye doctor in the last year. They admit to slight hearing difficulty with regard to whispered voices and some television programs.  They have deferred audiologic testing in the last year.  They do not  have excessive sun exposure. Discussed the need for sun protection: hats, long sleeves and use of sunscreen if there is significant sun exposure.   Diet: the importance of a healthy diet is discussed. They do have a healthy diet.  The benefits of regular aerobic exercise were discussed. She walks 4 times per week ,  20 minutes.   Depression screen: there are no signs or vegative symptoms of depression- irritability, change in appetite, anhedonia, sadness/tearfullness.  Cognitive assessment: the patient manages all their  financial and personal affairs and is actively engaged. They could relate day,date,year and events; recalled 2/3 objects at 3 minutes; performed clock-face test normally.  The following portions of the patient's history were reviewed and updated as appropriate: allergies, current medications, past family history, past medical history,  past surgical history, past social history  and problem list.  Visual acuity was not assessed per patient preference since she has regular follow up with her ophthalmologist. Hearing and body mass index were assessed and reviewed.   During the course of the visit the patient was educated and counseled about appropriate screening and preventive services including : fall prevention , diabetes screening, nutrition counseling, colorectal cancer screening, and recommended immunizations.    CC: The primary encounter diagnosis was Encounter for preventive health examination. Diagnoses of Need for immunization against influenza, Hyperlipidemia LDL goal <130, Colon cancer screening, Encounter for screening mammogram for malignant neoplasm of breast, Acquired hypothyroidism, and Rheumatoid arthritis involving multiple sites with positive rheumatoid factor ([place]) were also pertinent to this visit.   Had a very painful 2020 due to joint pain. [place] approved in April  And feels that it is helping with ankle pain as well  Has apt with Melissa Baker tomorrow   Going to [place]  for wedding.    Getting booster before thn  Needs refills on all meds.  Labs reiewed  FRC risk 8.5%   Defers colono ca screening with colonoscopy but agrees to use of cologuard    History Melissa Baker has a past medical history of Rheumatoid  arthritis involving both hands with positive rheumatoid factor (HCC) and Thyroid disease.   She has a past surgical history that includes No past surgeries.   Her family history includes Arthritis in her mother; Heart failure in her father; Varicose Veins in her  mother.She reports that she has never smoked. She has never used smokeless tobacco. She reports that she does not drink alcohol and does not use drugs.  Outpatient Medications Prior to Visit  Medication Sig Dispense Refill  . diclofenac Sodium (VOLTAREN) 1 % GEL Apply 2 g topically 2 (two) times daily.    . folic acid (FOLVITE) 1 MG tablet Take 2 mg by mouth daily.    Marland Kitchen HUMIRA PEN 40 MG/0.4ML PNKT SMARTSIG:40 Milligram(s) SUB-Q Every 2 Weeks    . levothyroxine (SYNTHROID) 100 MCG tablet Take 1 tablet (100 mcg total) by mouth daily before breakfast. 90 tablet 1  . meloxicam (MOBIC) 7.5 MG tablet Take 7.5 mg by mouth daily.    . methotrexate (RHEUMATREX) 2.5 MG tablet TAKE 8 TABLETS BY MOUTH EVERY 7 DAYS     No facility-administered medications prior to visit.    Review of Systems  Patient denies headache, fevers, malaise, unintentional weight loss, skin rash, eye pain, sinus congestion and sinus pain, sore throat, dysphagia,  hemoptysis , cough, dyspnea, wheezing, chest pain, palpitations, orthopnea, edema, abdominal pain, nausea, melena, diarrhea, constipation, flank pain, dysuria, hematuria, urinary  Frequency, nocturia, numbness, tingling, seizures,  Focal weakness, Loss of consciousness,  Tremor, insomnia, depression, anxiety, and suicidal ideation.     Objective:  BP 138/74 (BP Location: Left Arm, Patient Position: Sitting, Cuff Size: Normal)   Pulse 84   Temp 98.2 F (36.8 C) (Oral)   Resp 15   Ht 5\' 7"  (1.702 m)   Wt 169 lb 12.8 oz (77 kg)   SpO2 98%   BMI 26.59 kg/m   Physical Exam  General appearance: alert, cooperative and appears stated age Head: Normocephalic, without obvious abnormality, atraumatic Eyes: conjunctivae/corneas clear. PERRL, EOM's intact. Fundi benign. Ears: normal TM's and external ear canals both ears Nose: Nares normal. Septum midline. Mucosa normal. No drainage or sinus tenderness. Throat: lips, mucosa, and tongue normal; teeth and gums  normal Neck: no adenopathy, no carotid bruit, no JVD, supple, symmetrical, trachea midline and thyroid not enlarged, symmetric, no tenderness/mass/nodules Lungs: clear to auscultation bilaterally Breasts: normal appearance, no masses or tenderness Heart: regular rate and rhythm, S1, S2 normal, no murmur, click, rub or gallop Abdomen: soft, non-tender; bowel sounds normal; no masses,  no organomegaly Extremities: extremities normal, atraumatic, no cyanosis or edema Pulses: 2+ and symmetric Skin: Skin color, texture, turgor normal. No rashes or lesions Neurologic: Alert and oriented X 3, normal strength and tone. Normal symmetric reflexes. Normal coordination and gait.    Assessment & Plan:   Problem List Items Addressed This Visit      Unprioritized   Encounter for preventive health examination - Primary    age appropriate education and counseling updated, referrals for preventative services and immunizations addressed, dietary and smoking counseling addressed, most recent labs reviewed.  I have personally reviewed and have noted:  1) the patient's medical and social history 2) The pt's use of alcohol, tobacco, and illicit drugs 3) The patient's current medications and supplements 4) Functional ability including ADL's, fall risk, home safety risk, hearing and visual impairment 5) Diet and physical activities 6) Evidence for depression or mood disorder 7) The patient's height, weight, and BMI have been recorded in the chart  I have made referrals, and provided counseling and education based on review of the above      Hyperlipidemia LDL goal <130    Current risk is 8.5%  But has RA  Discussed risks s benefits of statin . Father died of MI at 76 .        Hypothyroidism    Thyroid function is WNL on current dose of 100 mcg daily .  No current changes needed.   Lab Results  Component Value Date   TSH 1.11 07/20/2020         Rheumatoid arthritis ([place])    Managed with  methotrexate, folic acid and Humira by Dr Melissa Baker.  surveillance labs are normal today   Lab Results  Component Value Date   WBC 4.9 07/20/2020   HGB 13.1 07/20/2020   HCT 38.6 07/20/2020   MCV 90.3 07/20/2020   PLT 249.0 07/20/2020   Lab Results  Component Value Date   ALT 15 07/20/2020   AST 14 07/20/2020   ALKPHOS 52 07/20/2020   BILITOT 0.5 07/20/2020         Relevant Medications   HUMIRA PEN 40 MG/0.4ML PNKT    Other Visit Diagnoses    Need for immunization against influenza       Relevant Orders   Flu Vaccine QUAD 36+ mos IM (Completed)   Colon cancer screening       Relevant Orders   Cologuard   Encounter for screening mammogram for malignant neoplasm of breast       Relevant Orders   MM 3D SCREEN BREAST BILATERAL      I am having Avri Reaser maintain her methotrexate, folic acid, meloxicam, levothyroxine, Humira Pen, and diclofenac Sodium.  No orders of the defined types were placed in this encounter.   There are no discontinued medications.  Follow-up: No follow-ups on file.   Crecencio Mc, MD

## 2020-07-23 NOTE — Patient Instructions (Signed)
Go and see your mother.  Do not let COVID prevent you from seeing her.   I have ordered the Cologuard test. This screening test for colon cancer is very accurate in patients who are at average risk for colon cancer, if  repeated  every 3 years, .  We can continue to use if until you are 84 for  colon CA screening, unless you develop a change in bowel function or a family history of colon Cancer.    Your annual mammogram has been ordered at [facility] Menopause is a normal process in which your ability to get pregnant comes to an end. This process happens slowly over many months or years, usually between the ages of 43 and 46. Menopause is complete when you have missed your menstrual periods for 12 months. It is important to talk with your health care provider about some of the most common conditions that affect women after menopause (postmenopausal women). These include heart disease, cancer, and bone loss (osteoporosis). Adopting a healthy lifestyle and getting preventive care can help to promote your health and wellness. The actions you take can also lower your chances of developing some of these common conditions. What should I know about menopause? During menopause, you may get a number of symptoms, such as:  Hot flashes. These can be moderate or severe.  Night sweats.  Decrease in sex drive.  Mood swings.  Headaches.  Tiredness.  Irritability.  Memory problems.  Insomnia. Choosing to treat or not to treat these symptoms is a decision that you make with your health care provider. Do I need hormone replacement therapy?  Hormone replacement therapy is effective in treating symptoms that are caused by menopause, such as hot flashes and night sweats.  Hormone replacement carries certain risks, especially as you become older. If you are thinking about using estrogen or estrogen with progestin, discuss the benefits and risks with your  health care provider. What is my risk for heart disease and stroke? The risk of heart disease, heart attack, and stroke increases as you age. One of the causes may be a change in the body's hormones during menopause. This can affect how your body uses dietary fats, triglycerides, and cholesterol. Heart attack and stroke are medical emergencies. There are many things that you can do to help prevent heart disease and stroke. Watch your blood pressure  High blood pressure causes heart disease and increases the risk of stroke. This is more likely to develop in people who have high blood pressure readings, are of African descent, or are overweight.  Have your blood pressure checked: ? Every 3-5 years if you are 81-32 years of age. ? Every year if you are 71 years old or older. Eat a healthy diet   Eat a diet that includes plenty of vegetables, fruits, low-fat dairy products, and lean protein.  Do not eat a lot of foods that are high in solid fats, added sugars, or sodium. Get regular exercise Get regular exercise. This is one of the most important things you can do for your health. Most adults should:  Try to exercise for at least 150 minutes each week. The exercise should increase your heart rate and make you sweat (moderate-intensity exercise).  Try to do strengthening exercises at least twice each week. Do these in addition to the moderate-intensity exercise.  Spend less time sitting. Even light physical activity can be beneficial. Other tips  Work with your health care  provider to achieve or maintain a healthy weight.  Do not use any products that contain nicotine or tobacco, such as cigarettes, e-cigarettes, and chewing tobacco. If you need help quitting, ask your health care provider.  Know your numbers. Ask your health care provider to check your cholesterol and your blood sugar (glucose). Continue to have your blood tested as directed by your health care provider. Do I need  screening for cancer? Depending on your health history and family history, you may need to have cancer screening at different stages of your life. This may include screening for:  Breast cancer.  Cervical cancer.  Lung cancer.  Colorectal cancer. What is my risk for osteoporosis? After menopause, you may be at increased risk for osteoporosis. Osteoporosis is a condition in which bone destruction happens more quickly than new bone creation. To help prevent osteoporosis or the bone fractures that can happen because of osteoporosis, you may take the following actions:  If you are 19-41 years old, get at least 1,000 mg of calcium and at least 600 mg of vitamin D per day.  If you are older than age 71 but younger than age 24, get at least 1,200 mg of calcium and at least 600 mg of vitamin D per day.  If you are older than age 60, get at least 1,200 mg of calcium and at least 800 mg of vitamin D per day. Smoking and drinking excessive alcohol increase the risk of osteoporosis. Eat foods that are rich in calcium and vitamin D, and do weight-bearing exercises several times each week as directed by your health care provider. How does menopause affect my mental health? Depression may occur at any age, but it is more common as you become older. Common symptoms of depression include:  Low or sad mood.  Changes in sleep patterns.  Changes in appetite or eating patterns.  Feeling an overall lack of motivation or enjoyment of activities that you previously enjoyed.  Frequent crying spells. Talk with your health care provider if you think that you are experiencing depression. General instructions See your health care provider for regular wellness exams and vaccines. This may include:  Scheduling regular health, dental, and eye exams.  Getting and maintaining your vaccines. These include: ? Influenza vaccine. Get this vaccine each year before the flu season begins. ? Pneumonia  vaccine. ? Shingles vaccine. ? Tetanus, diphtheria, and pertussis (Tdap) booster vaccine. Your health care provider may also recommend other immunizations. Tell your health care provider if you have ever been abused or do not feel safe at home. Summary  Menopause is a normal process in which your ability to get pregnant comes to an end.  This condition causes hot flashes, night sweats, decreased interest in sex, mood swings, headaches, or lack of sleep.  Treatment for this condition may include hormone replacement therapy.  Take actions to keep yourself healthy, including exercising regularly, eating a healthy diet, watching your weight, and checking your blood pressure and blood sugar levels.  Get screened for cancer and depression. Make sure that you are up to date with all your vaccines. This information is not intended to replace advice given to you by your health care provider. Make sure you discuss any questions you have with your health care provider. Document Revised: 09/29/2018 Document Reviewed: 09/29/2018 Elsevier Patient Education  2020 [employer].

## 2020-07-23 NOTE — Telephone Encounter (Signed)
Patient need fasting lab order put in for 07-19-2021

## 2020-07-24 ENCOUNTER — Telehealth: Payer: Self-pay

## 2020-07-24 NOTE — Telephone Encounter (Signed)
error 

## 2020-07-24 NOTE — Assessment & Plan Note (Signed)

## 2020-07-24 NOTE — Assessment & Plan Note (Signed)
Thyroid function is WNL on current dose of 100 mcg daily .  No current changes needed.   Lab Results  Component Value Date   TSH 1.11 07/20/2020

## 2020-07-24 NOTE — Assessment & Plan Note (Signed)
Managed with methotrexate, folic acid and Humira by Dr Jefm Bryant.  surveillance labs are normal today   Lab Results  Component Value Date   WBC 4.9 07/20/2020   HGB 13.1 07/20/2020   HCT 38.6 07/20/2020   MCV 90.3 07/20/2020   PLT 249.0 07/20/2020   Lab Results  Component Value Date   ALT 15 07/20/2020   AST 14 07/20/2020   ALKPHOS 52 07/20/2020   BILITOT 0.5 07/20/2020

## 2020-08-07 NOTE — Telephone Encounter (Signed)
Labs have been ordered

## 2020-10-16 ENCOUNTER — Other Ambulatory Visit: Payer: Self-pay | Admitting: Family

## 2021-04-20 ENCOUNTER — Other Ambulatory Visit: Payer: Self-pay | Admitting: Family

## 2021-04-21 ENCOUNTER — Other Ambulatory Visit: Payer: Self-pay | Admitting: Family Medicine

## 2021-04-21 DIAGNOSIS — E039 Hypothyroidism, unspecified: Secondary | ICD-10-CM

## 2021-04-21 MED ORDER — LEVOTHYROXINE SODIUM 100 MCG PO TABS: 100.0000 ug | ORAL_TABLET | Freq: Every day | ORAL | 0 refills | Status: DC

## 2021-04-21 NOTE — Progress Notes (Signed)
Patient called nurse advice line. [place] is leaving [place]. This is a sudden trip and so had not been able to plan ahead. Request refill of levothyroxine.  1. Acquired hypothyroidism  - levothyroxine (EUTHYROX) 100 MCG tablet; Take 1 tablet (100 mcg total) by mouth daily before breakfast.  Dispense: 90 tablet; Refill: 0  Haydee Salter, MD

## 2021-04-23 ENCOUNTER — Telehealth: Payer: Self-pay

## 2021-04-23 NOTE — Telephone Encounter (Signed)
Pt's husband called into access nurse on 04/21/21 to ask for a medication refill on Levothyroxine. Medication has been refilled by Arlester Marker on 04/21/21.

## 2021-06-04 ENCOUNTER — Telehealth: Payer: Self-pay | Admitting: Internal Medicine

## 2021-06-04 NOTE — Telephone Encounter (Signed)
Patient said that she needs to see Dr Derrel Nip. Patient was offered an appointment with Vidal Schwalbe, she refused. She would like to speak to her about after COVID side effects, patient hasd covid in July.

## 2021-06-05 ENCOUNTER — Other Ambulatory Visit: Payer: Self-pay

## 2021-06-05 ENCOUNTER — Ambulatory Visit (INDEPENDENT_AMBULATORY_CARE_PROVIDER_SITE_OTHER): Payer: 59 | Admitting: Internal Medicine

## 2021-06-05 ENCOUNTER — Encounter: Payer: Self-pay | Admitting: Internal Medicine

## 2021-06-05 VITALS — BP 130/84 | HR 81 | Temp 96.4°F | Resp 16 | Ht 67.0 in | Wt 165.6 lb

## 2021-06-05 DIAGNOSIS — G933 Postviral fatigue syndrome: Secondary | ICD-10-CM

## 2021-06-05 DIAGNOSIS — F4321 Adjustment disorder with depressed mood: Secondary | ICD-10-CM | POA: Diagnosis not present

## 2021-06-05 DIAGNOSIS — G9331 Postviral fatigue syndrome: Secondary | ICD-10-CM

## 2021-06-05 DIAGNOSIS — M791 Myalgia, unspecified site: Secondary | ICD-10-CM | POA: Diagnosis not present

## 2021-06-05 DIAGNOSIS — E039 Hypothyroidism, unspecified: Secondary | ICD-10-CM

## 2021-06-05 NOTE — Progress Notes (Signed)
Subjective:  Patient ID: Melissa Baker, female    DOB: 1965-12-26  Age: 55 y.o. MRN: [id number]  CC: The primary encounter diagnosis was Myalgia. Diagnoses of Postviral fatigue syndrome, Acquired hypothyroidism, and Grief reaction were also pertinent to this visit.  HPI [name] presents for evaluation of post covid symptoms of fatigue and malaise  This visit occurred during the SARS-CoV-2 public health emergency.  Safety protocols were in place, including screening questions prior to the visit, additional usage of staff PPE, and extensive cleaning of exam room while observing appropriate contact time as indicated for disinfecting solutions.   [name] is a 55 yr old woman with a history of Rheumatoid Arthritis managed by [facility] with Humira.  She Has travelled to [place] twice since March to provide are for her sister who passed away recently from endometrial cancer.  She had a  COVID INFECTION in July which occcurred  after having contact with an infected aunt  on July 8 ,  she had a mild case.  One day of rhinitis,  headache and low grade fever .    Returned to [place] on August 5 . Felt fine while in [place].  Since her return she has had recurrent cramping in multiple muscle groups  and excessive fatigue .  She denies feeling dehydrated;  she is drinking  at least 8 glasses of water daily   Outpatient Medications Prior to Visit  Medication Sig Dispense Refill   Biotin 1000 MCG tablet Take 1,000 mcg by mouth daily.     calcium carbonate (CALCIUM 600) 600 MG TABS tablet Take 600 mg by mouth daily with breakfast.     cholecalciferol (VITAMIN D3) 25 MCG (1000 UNIT) tablet Take 1,000 Units by mouth daily.     diclofenac Sodium (VOLTAREN) 1 % GEL Apply 2 g topically 2 (two) times daily.     folic acid (FOLVITE) 1 MG tablet Take 2 mg by mouth daily.     HUMIRA PEN 40 MG/0.4ML PNKT SMARTSIG:40 Milligram(s) SUB-Q Every 2 Weeks     levothyroxine (EUTHYROX) 100 MCG tablet Take 1 tablet (100 mcg  total) by mouth daily before breakfast. 90 tablet 0   meloxicam (MOBIC) 7.5 MG tablet Take 7.5 mg by mouth daily.     methotrexate (RHEUMATREX) 2.5 MG tablet TAKE 8 TABLETS BY MOUTH EVERY 7 DAYS     No facility-administered medications prior to visit.    Review of Systems;  Patient denies headache, fevers, malaise, unintentional weight loss, skin rash, eye pain, sinus congestion and sinus pain, sore throat, dysphagia,  hemoptysis , cough, dyspnea, wheezing, chest pain, palpitations, orthopnea, edema, abdominal pain, nausea, melena, diarrhea, constipation, flank pain, dysuria, hematuria, urinary  Frequency, nocturia, numbness, tingling, seizures,  Focal weakness, Loss of consciousness,  Tremor, insomnia, depression, anxiety, and suicidal ideation.      Objective:  BP 130/84 (BP Location: Left Arm, Patient Position: Sitting, Cuff Size: Normal)   Pulse 81   Temp (!) 96.4 F (35.8 C) (Temporal)   Resp 16   Ht 5' 7" (1.702 m)   Wt 165 lb 9.6 oz (75.1 kg)   SpO2 99%   BMI 25.94 kg/m   BP Readings from Last 3 Encounters:  06/05/21 130/84  07/23/20 138/74  07/13/19 118/72    Wt Readings from Last 3 Encounters:  06/05/21 165 lb 9.6 oz (75.1 kg)  07/23/20 169 lb 12.8 oz (77 kg)  07/13/19 169 lb (76.7 kg)    General appearance: alert, cooperative and appears stated age  Ears: normal TM's and external ear canals both ears Throat: lips, mucosa, and tongue normal; teeth and gums normal Neck: no adenopathy, no carotid bruit, supple, symmetrical, trachea midline and thyroid not enlarged, symmetric, no tenderness/mass/nodules Back: symmetric, no curvature. ROM normal. No CVA tenderness. Lungs: clear to auscultation bilaterally Heart: regular rate and rhythm, S1, S2 normal, no murmur, click, rub or gallop Abdomen: soft, non-tender; bowel sounds normal; no masses,  no organomegaly Pulses: 2+ and symmetric Skin: Skin color, texture, turgor normal. No rashes or lesions Lymph nodes: Cervical,  supraclavicular, and axillary nodes normal.  Lab Results  Component Value Date   HGBA1C 5.5 07/20/2020    Lab Results  Component Value Date   CREATININE 0.62 06/05/2021   CREATININE 0.70 07/20/2020   CREATININE 0.52 07/11/2019    Lab Results  Component Value Date   WBC 6.3 06/05/2021   HGB 12.9 06/05/2021   HCT 39.2 06/05/2021   PLT 331.0 06/05/2021   GLUCOSE 84 06/05/2021   CHOL 242 (H) 07/20/2020   TRIG 177.0 (H) 07/20/2020   HDL 50.80 07/20/2020   LDLCALC 156 (H) 07/20/2020   ALT 23 06/05/2021   AST 18 06/05/2021   NA 138 06/05/2021   K 4.0 06/05/2021   CL 101 06/05/2021   CREATININE 0.62 06/05/2021   BUN 16 06/05/2021   CO2 28 06/05/2021   TSH 1.11 07/20/2020   HGBA1C 5.5 07/20/2020    MR ANKLE RIGHT WO CONTRAST  Result Date: 01/26/2020 CLINICAL DATA:  Injury March 2020, slipped on water in the garage, lateral ankle pain EXAM: MRI OF THE RIGHT ANKLE WITHOUT CONTRAST TECHNIQUE: Multiplanar, multisequence MR imaging of the ankle was performed. No intravenous contrast was administered. COMPARISON:  None FINDINGS: TENDONS Peroneal: Peroneal longus tendon intact. Peroneal brevis intact. Posteromedial: Posterior tibial tendon intact. Flexor hallucis longus tendon intact. Flexor digitorum longus tendon intact. A tiny amount of fluid is seen surrounding the posterior tibialis, flexor digitorum, flexor hallucis longus tendons. Anterior: Tibialis anterior tendon intact. Extensor hallucis longus tendon intact Extensor digitorum longus tendon intact. Achilles:  Intact. Plantar Fascia: Mildly increased intrasubstance signal seen within the middle cord of the plantar fascia. LIGAMENTS Lateral: Anterior talofibular ligament intact. Calcaneofibular ligament intact. Posterior talofibular ligament intact. Anterior and posterior tibiofibular ligaments intact. Medial: There is heterogeneous signal seen within the deep fibers of the deltoid ligament, however it is intact. There is also  thickening seen within the spring ligament, however it is intact. CARTILAGE Ankle Joint: There is a trace ankle joint effusion. There is cortical irregularity seen at the posterior medial talar dome with underlying subchondral cystic changes, likely from prior osteochondral injury. This area measures 1.6 cm in AP dimension and 0.6 cm in transverse dimension. There is also joint space loss seen throughout the medial tibiotalar joint with subchondral marrow signal changes and anterior osteophyte at the anterior tibia. No displaced osteochondral fragment however is noted. Subtalar Joints/Sinus Tarsi: There is subchondral marrow signal changes seen at the lateral subtalar joint within the talar body and lateral process. A small amount of fluid and edema seen within the sinus tarsi. Bones: No acute fracture or avascular necrosis is noted. Soft Tissue: No focal soft tissue mass is seen. There is mild soft tissue swelling seen around the lateral ankle. IMPRESSION: 1. 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 No displaced osteochondral fragment however is noted. 2. Medial tibiotalar joint   osteoarthritis. 3. 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 4. 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 5. Mild posterior tibialis, flexor digitorum, flexor hallucis longus tenosynovitis 6. Findings suggestive of prior deltoid ligament and spring ligament sprain Electronically Signed   By: Prudencio Pair M.D.   On: 01/26/2020 20:04    Assessment & Plan:   Problem List Items Addressed This Visit       Unprioritized   Grief reaction    Patient is dealing with the untimely loss of her sister but she  has adequate coping skills and emotional support .  i have asked patinet to return in one month to examine for signs of unresolving grief.        Hypothyroidism    Thyroid function is WNL on current dose of 100 mcg daily .  No current changes needed.   Lab Results  Component Value Date   TSH 1.11 07/20/2020         Myalgia - Primary    Etiology unclear;  All electrolytes, thyroid function,  B12  And CK are normal. She has a normal ESR and CBC as well.    Lab Results  Component Value Date   ESRSEDRATE 6 06/05/2021   Lab Results  Component Value Date   WBC 6.3 06/05/2021   HGB 12.9 06/05/2021   HCT 39.2 06/05/2021   MCV 90.4 06/05/2021   PLT 331.0 06/05/2021    Lab Results  Component Value Date   NA 138 06/05/2021   K 4.0 06/05/2021   CL 101 06/05/2021   CO2 28 06/05/2021   Lab Results  Component Value Date   CKTOTAL 62 06/05/2021   Lab Results  Component Value Date   TSH 1.11 07/20/2020   Lab Results  Component Value Date   VITAMINB12 328 06/05/2021         Relevant Orders   Thyroid Panel With TSH   CK (Creatine Kinase) (Completed)   Magnesium (Completed)   Comprehensive metabolic panel (Completed)   Postviral fatigue syndrome    Complicated by grief due to loss of beloved sister .  All screenings are normal. She has no signs or symptoms of  cardiomyopathy      Relevant Orders   Sedimentation rate (Completed)   CBC with Differential/Platelet (Completed)   Vitamin B12 (Completed)    I spent 40 minutes dedicated to the care of this patient on the date of this encounter to include pre-visit review of her medical history,  Face-to-face time with the patient , counselling on the management of grief, and post visit ordering of testing and therapeutics.   There are no discontinued medications.  Follow-up: No follow-ups on file.   Crecencio Mc, MD

## 2021-06-05 NOTE — Telephone Encounter (Signed)
Pt was seen in the office today.  

## 2021-06-05 NOTE — Telephone Encounter (Signed)
Left message to return call 

## 2021-06-05 NOTE — Patient Instructions (Addendum)
I am so sorry for your loss.     Please add one ounce of dill pickle juice daily to see if it helps your muscle cramps  Suspend the meloxicam .  Use tylenol instead.    You can take up to 2000 mg of acetominophen (tylenol) every day safely  In divided doses (500 mg every 6 hours  Or 1000 mg every 12 hours.)

## 2021-06-05 NOTE — Telephone Encounter (Signed)
Please advise 

## 2021-06-05 NOTE — Telephone Encounter (Signed)
Spoke with pt and scheduled her for an appt today.

## 2021-06-06 LAB — COMPREHENSIVE METABOLIC PANEL
ALT: 23 U/L (ref 0–35)
AST: 18 U/L (ref 0–37)
Albumin: 4.4 g/dL (ref 3.5–5.2)
Alkaline Phosphatase: 65 U/L (ref 39–117)
BUN: 16 mg/dL (ref 6–23)
CO2: 28 mEq/L (ref 19–32)
Calcium: 10.3 mg/dL (ref 8.4–10.5)
Chloride: 101 mEq/L (ref 96–112)
Creatinine, Ser: 0.62 mg/dL (ref 0.40–1.20)
GFR: 99.13 mL/min (ref >60.00)
Glucose, Bld: 84 mg/dL (ref 70–99)
Potassium: 4 mEq/L (ref 3.5–5.1)
Sodium: 138 mEq/L (ref 135–145)
Total Bilirubin: 0.4 mg/dL (ref 0.2–1.2)
Total Protein: 7.3 g/dL (ref 6.0–8.3)

## 2021-06-06 LAB — CBC WITH DIFFERENTIAL/PLATELET
Basophils Absolute: 0.1 10*3/uL (ref 0.0–0.1)
Basophils Relative: 1 % (ref 0.0–3.0)
Eosinophils Absolute: 0.1 10*3/uL (ref 0.0–0.7)
Eosinophils Relative: 1.5 % (ref 0.0–5.0)
HCT: 39.2 % (ref 36.0–46.0)
Hemoglobin: 12.9 g/dL (ref 12.0–15.0)
Lymphocytes Relative: 34.6 % (ref 12.0–46.0)
Lymphs Abs: 2.2 10*3/uL (ref 0.7–4.0)
MCHC: 33 g/dL (ref 30.0–36.0)
MCV: 90.4 fl (ref 78.0–100.0)
Monocytes Absolute: 0.3 10*3/uL (ref 0.1–1.0)
Monocytes Relative: 4.8 % (ref 3.0–12.0)
Neutro Abs: 3.6 10*3/uL (ref 1.4–7.7)
Neutrophils Relative %: 58.1 % (ref 43.0–77.0)
Platelets: 331 10*3/uL (ref 150.0–400.0)
RBC: 4.34 Mil/uL (ref 3.87–5.11)
RDW: 15.4 % (ref 11.5–15.5)
WBC: 6.3 10*3/uL (ref 4.0–10.5)

## 2021-06-06 LAB — MAGNESIUM: Magnesium: 2.1 mg/dL (ref 1.5–2.5)

## 2021-06-06 LAB — CK: Total CK: 62 U/L (ref 7–177)

## 2021-06-06 LAB — VITAMIN B12: Vitamin B-12: 328 pg/mL (ref 211–911)

## 2021-06-06 LAB — SEDIMENTATION RATE: Sed Rate: 6 mm/hr (ref 0–30)

## 2021-06-07 DIAGNOSIS — M791 Myalgia, unspecified site: Secondary | ICD-10-CM | POA: Insufficient documentation

## 2021-06-07 DIAGNOSIS — F4321 Adjustment disorder with depressed mood: Secondary | ICD-10-CM | POA: Insufficient documentation

## 2021-06-07 DIAGNOSIS — G9331 Postviral fatigue syndrome: Secondary | ICD-10-CM | POA: Insufficient documentation

## 2021-06-07 DIAGNOSIS — G933 Postviral fatigue syndrome: Secondary | ICD-10-CM | POA: Insufficient documentation

## 2021-06-07 NOTE — Assessment & Plan Note (Addendum)
Etiology unclear;  All electrolytes, thyroid function,  B12  And CK are normal. She has a normal ESR and CBC as well.    Lab Results  Component Value Date   ESRSEDRATE 6 06/05/2021   Lab Results  Component Value Date   WBC 6.3 06/05/2021   HGB 12.9 06/05/2021   HCT 39.2 06/05/2021   MCV 90.4 06/05/2021   PLT 331.0 06/05/2021    Lab Results  Component Value Date   NA 138 06/05/2021   K 4.0 06/05/2021   CL 101 06/05/2021   CO2 28 06/05/2021   Lab Results  Component Value Date   CKTOTAL 62 06/05/2021   Lab Results  Component Value Date   TSH 1.11 07/20/2020   Lab Results  Component Value Date   FXOVANVB16 606 06/05/2021

## 2021-06-07 NOTE — Assessment & Plan Note (Signed)
Thyroid function is WNL on current dose of 100 mcg daily .  No current changes needed.   Lab Results  Component Value Date   TSH 1.11 07/20/2020

## 2021-06-07 NOTE — Assessment & Plan Note (Signed)
Complicated by grief due to loss of beloved sister .  All screenings are normal. She has no signs or symptoms of  cardiomyopathy

## 2021-06-07 NOTE — Assessment & Plan Note (Signed)
Patient is dealing with the untimely loss of her sister but she  has adequate coping skills and emotional support .  i have asked patinet to return in one month to examine for signs of unresolving grief.

## 2021-06-26 MED ORDER — FOLIC ACID 1 MG PO TABS: 2.0000 mg | ORAL_TABLET | Freq: Every day | ORAL | 2 refills | Status: DC

## 2021-07-19 ENCOUNTER — Other Ambulatory Visit: Payer: Self-pay

## 2021-07-19 ENCOUNTER — Other Ambulatory Visit (INDEPENDENT_AMBULATORY_CARE_PROVIDER_SITE_OTHER): Payer: 59

## 2021-07-19 DIAGNOSIS — M791 Myalgia, unspecified site: Secondary | ICD-10-CM | POA: Diagnosis not present

## 2021-07-19 DIAGNOSIS — E039 Hypothyroidism, unspecified: Secondary | ICD-10-CM | POA: Diagnosis not present

## 2021-07-19 DIAGNOSIS — E785 Hyperlipidemia, unspecified: Secondary | ICD-10-CM

## 2021-07-19 DIAGNOSIS — R7301 Impaired fasting glucose: Secondary | ICD-10-CM

## 2021-07-19 LAB — TSH: TSH: 0.35 u[IU]/mL (ref 0.35–5.50)

## 2021-07-19 LAB — HEMOGLOBIN A1C: Hgb A1c MFr Bld: 5.5 % (ref 4.6–6.5)

## 2021-07-19 LAB — LIPID PANEL
Cholesterol: 246 mg/dL — ABNORMAL HIGH (ref 0–200)
HDL: 63.8 mg/dL (ref >39.00)
LDL Cholesterol: 150 mg/dL — ABNORMAL HIGH (ref 0–99)
NonHDL: 182.03
Total CHOL/HDL Ratio: 4
Triglycerides: 160 mg/dL — ABNORMAL HIGH (ref 0.0–149.0)
VLDL: 32 mg/dL (ref 0.0–40.0)

## 2021-07-20 LAB — THYROID PANEL WITH TSH
Free Thyroxine Index: 2.9 (ref 1.4–3.8)
T3 Uptake: 30 % (ref 22–35)
T4, Total: 9.8 ug/dL (ref 5.1–11.9)
TSH: 0.34 mIU/L — ABNORMAL LOW (ref 0.40–4.50)

## 2021-07-23 ENCOUNTER — Ambulatory Visit (INDEPENDENT_AMBULATORY_CARE_PROVIDER_SITE_OTHER): Payer: 59 | Admitting: Internal Medicine

## 2021-07-23 ENCOUNTER — Telehealth: Payer: Self-pay | Admitting: Internal Medicine

## 2021-07-23 ENCOUNTER — Other Ambulatory Visit: Payer: Self-pay

## 2021-07-23 ENCOUNTER — Encounter: Payer: Self-pay | Admitting: Internal Medicine

## 2021-07-23 VITALS — BP 116/78 | HR 75 | Temp 97.1°F | Ht 67.0 in | Wt 166.4 lb

## 2021-07-23 DIAGNOSIS — L853 Xerosis cutis: Secondary | ICD-10-CM

## 2021-07-23 DIAGNOSIS — Z0000 Encounter for general adult medical examination without abnormal findings: Secondary | ICD-10-CM | POA: Diagnosis not present

## 2021-07-23 DIAGNOSIS — Z1231 Encounter for screening mammogram for malignant neoplasm of breast: Secondary | ICD-10-CM

## 2021-07-23 DIAGNOSIS — Z1211 Encounter for screening for malignant neoplasm of colon: Secondary | ICD-10-CM

## 2021-07-23 DIAGNOSIS — E039 Hypothyroidism, unspecified: Secondary | ICD-10-CM | POA: Diagnosis not present

## 2021-07-23 DIAGNOSIS — Z23 Encounter for immunization: Secondary | ICD-10-CM

## 2021-07-23 DIAGNOSIS — F4321 Adjustment disorder with depressed mood: Secondary | ICD-10-CM

## 2021-07-23 DIAGNOSIS — F4320 Adjustment disorder, unspecified: Secondary | ICD-10-CM

## 2021-07-23 DIAGNOSIS — E785 Hyperlipidemia, unspecified: Secondary | ICD-10-CM

## 2021-07-23 MED ORDER — LEVOTHYROXINE SODIUM 88 MCG PO TABS: 88.0000 ug | ORAL_TABLET | Freq: Every day | ORAL | 1 refills | Status: DC

## 2021-07-23 NOTE — Assessment & Plan Note (Signed)
TSH IS suppressed on 100 mcg but she is taking biotin and has not suspended it . Recheck on Monday after 72 hour suspension.s  Continue 100  Mcg for now.

## 2021-07-23 NOTE — Assessment & Plan Note (Signed)
She is not depressed.  She is in grief

## 2021-07-23 NOTE — Assessment & Plan Note (Addendum)
Current risk is 8.5%  But has RA  .  We have discussed risks s benefits of statin. She remains contemplative due to recurrent myalgias and the concern that statin therapy mag aggravate her pain . She has a family history (but not early.  Lab Results  Component Value Date   CHOL 246 (H) 07/19/2021   HDL 63.80 07/19/2021   LDLCALC 150 (H) 07/19/2021   TRIG 160.0 (H) 07/19/2021   CHOLHDL 4 07/19/2021

## 2021-07-23 NOTE — Patient Instructions (Addendum)
Thyroid may be off due to biotin use  Suspend biotin supplement and repeat TSH on Monday  Do not pick up the lower thyroid dose.  Continue 100 mcg for now until we recheck your level  Dermatology referral and cologuard ordered  Your annual mammogram has been ordered.  You are encouraged (required) to call to make your appointment at [facility] [phone]

## 2021-07-23 NOTE — Progress Notes (Signed)
Patient ID: Melissa Baker, female    DOB: April 05, 1966  Age: 55 y.o. MRN: [id number]  The patient is here for annual preventive  examination and management of other chronic and acute problems.  This visit occurred during the SARS-CoV-2 public health emergency.  Safety protocols were in place, including screening questions prior to the visit, additional usage of staff PPE, and extensive cleaning of exam room while observing appropriate contact time as indicated for disinfecting solutions.     The risk factors are reflected in the social history.  The roster of all physicians providing medical care to patient - is listed in the Snapshot section of the chart.  Activities of daily living:  The patient is 100% independent in all ADLs: dressing, toileting, feeding as well as independent mobility  Home safety : The patient has smoke detectors in the home. They wear seatbelts.  There are no firearms at home. There is no violence in the home.   There is no risks for hepatitis, STDs or HIV. There is no   history of blood transfusion. They have no travel history to infectious disease endemic areas of the world.  The patient has seen their dentist in the last six month. They have seen their eye doctor in the last year. They admit to slight hearing difficulty with regard to whispered voices and some television programs.  They have deferred audiologic testing in the last year.  They do not  have excessive sun exposure. Discussed the need for sun protection: hats, long sleeves and use of sunscreen if there is significant sun exposure.   Diet: the importance of a healthy diet is discussed. They do have a healthy diet.  The benefits of regular aerobic exercise were discussed. She walks 4 times per week ,  20 minutes.   Depression screen: there are no signs or vegative symptoms of depression- irritability, change in appetite, anhedonia, sadness/tearfullness.  Cognitive assessment: the patient manages all their  financial and personal affairs and is actively engaged. They could relate day,date,year and events; recalled 2/3 objects at 3 minutes; performed clock-face test normally.  The following portions of the patient's history were reviewed and updated as appropriate: allergies, current medications, past family history, past medical history,  past surgical history, past social history  and problem list.  Visual acuity was not assessed per patient preference since she has regular follow up with her ophthalmologist. Hearing and body mass index were assessed and reviewed.   During the course of the visit the patient was educated and counseled about appropriate screening and preventive services including : fall prevention , diabetes screening, nutrition counseling, colorectal cancer screening, and recommended immunizations.    CC: The primary encounter diagnosis was Encounter for screening mammogram for malignant neoplasm of breast. Diagnoses of Acquired hypothyroidism, Colon cancer screening, Grief reaction, Xerosis of skin, Encounter for preventive health examination, and Hyperlipidemia LDL goal <130 were also pertinent to this visit.  1) still having left knee pain,  referred to orthopedics by [facility] for Baker's Cyst   History Macala has a past medical history of Rheumatoid arthritis involving both hands with positive rheumatoid factor ([place]) and Thyroid disease.   She has a past surgical history that includes No past surgeries.   Her family history includes Arthritis in her mother; Heart failure in her father; Varicose Veins in her mother.She reports that she has never smoked. She has never used smokeless tobacco. She reports that she does not drink alcohol and does not use drugs.  Outpatient  Medications Prior to Visit  Medication Sig Dispense Refill   Biotin 1000 MCG tablet Take 1,000 mcg by mouth daily.     calcium carbonate (OS-CAL) 600 MG TABS tablet Take 600 mg by mouth daily with breakfast.      cholecalciferol (VITAMIN D3) 25 MCG (1000 UNIT) tablet Take 1,000 Units by mouth daily.     diclofenac Sodium (VOLTAREN) 1 % GEL Apply 2 g topically 2 (two) times daily.     folic acid (FOLVITE) 1 MG tablet Take 2 tablets (2 mg total) by mouth daily. 180 tablet 2   HUMIRA PEN 40 MG/0.4ML PNKT SMARTSIG:40 Milligram(s) SUB-Q Every 2 Weeks     meloxicam (MOBIC) 7.5 MG tablet Take 7.5 mg by mouth daily.     methotrexate (RHEUMATREX) 2.5 MG tablet TAKE 8 TABLETS BY MOUTH EVERY 7 DAYS     levothyroxine (EUTHYROX) 100 MCG tablet Take 1 tablet (100 mcg total) by mouth daily before breakfast. 90 tablet 0   No facility-administered medications prior to visit.    Review of Systems  Patient denies headache, fevers, malaise, unintentional weight loss, skin rash, eye pain, sinus congestion and sinus pain, sore throat, dysphagia,  hemoptysis , cough, dyspnea, wheezing, chest pain, palpitations, orthopnea, edema, abdominal pain, nausea, melena, diarrhea, constipation, flank pain, dysuria, hematuria, urinary  Frequency, nocturia, numbness, tingling, seizures,  Focal weakness, Loss of consciousness,  Tremor, insomnia, depression, anxiety, and suicidal ideation.     Objective:  BP 116/78 (BP Location: Left Arm, Patient Position: Sitting, Cuff Size: Normal)   Pulse 75   Temp (!) 97.1 F (36.2 C) (Temporal)   Ht 5\' 7"  (1.702 m)   Wt 166 lb 6.4 oz (75.5 kg)   SpO2 98%   BMI 26.06 kg/m   Physical Exam  General appearance: alert, cooperative and appears stated age Head: Normocephalic, without obvious abnormality, atraumatic Eyes: conjunctivae/corneas clear. PERRL, EOM's intact. Fundi benign. Ears: normal TM's and external ear canals both ears Nose: Nares normal. Septum midline. Mucosa normal. No drainage or sinus tenderness. Throat: lips, mucosa, and tongue normal; teeth and gums normal Neck: no adenopathy, no carotid bruit, no JVD, supple, symmetrical, trachea midline and thyroid not enlarged, symmetric,  no tenderness/mass/nodules Lungs: clear to auscultation bilaterally Breasts: normal appearance, no masses or tenderness Heart: regular rate and rhythm, S1, S2 normal, no murmur, click, rub or gallop Abdomen: soft, non-tender; bowel sounds normal; no masses,  no organomegaly Extremities: extremities normal, atraumatic, no cyanosis or edema Pulses: 2+ and symmetric Skin: Skin color, texture, turgor normal. No rashes or lesions Neurologic: Alert and oriented X 3, normal strength and tone. Normal symmetric reflexes. Normal coordination and gait.     Assessment & Plan:   Problem List Items Addressed This Visit       Unprioritized   Encounter for preventive health examination    age appropriate education and counseling updated, referrals for preventative services and immunizations addressed, dietary and smoking counseling addressed, most recent labs reviewed.  I have personally reviewed and have noted:   1) the patient's medical and social history 2) The pt's use of alcohol, tobacco, and illicit drugs 3) The patient's current medications and supplements 4) Functional ability including ADL's, fall risk, home safety risk, hearing and visual impairment 5) Diet and physical activities 6) Evidence for depression or mood disorder 7) The patient's height, weight, and BMI have been recorded in the chart   I have made referrals, and provided counseling and education based on review of the above  Grief reaction    She is not depressed.  She is in grief       Hyperlipidemia LDL goal <130    Current risk is 8.5%  But has RA  .  We have discussed risks s benefits of statin. She remains contemplative due to recurrent myalgias and the concern that statin therapy mag aggravate her pain . She has a family history (but not early.  Lab Results  Component Value Date   CHOL 246 (H) 07/19/2021   HDL 63.80 07/19/2021   LDLCALC 150 (H) 07/19/2021   TRIG 160.0 (H) 07/19/2021   CHOLHDL 4 07/19/2021           Hypothyroidism    TSH IS suppressed on 100 mcg but she is taking biotin and has not suspended it . Recheck on Monday after 72 hour suspension.s  Continue 100  Mcg for now.       Relevant Orders   TSH   Other Visit Diagnoses     Encounter for screening mammogram for malignant neoplasm of breast    -  Primary   Relevant Orders   MM 3D SCREEN BREAST BILATERAL   Colon cancer screening       Relevant Orders   Cologuard   Xerosis of skin       Relevant Orders   Ambulatory referral to Dermatology       I have discontinued Annaya Robleto's levothyroxine and levothyroxine. I am also having her maintain her methotrexate, meloxicam, Humira Pen, diclofenac Sodium, cholecalciferol, calcium carbonate, Biotin, and folic acid.  Meds ordered this encounter  Medications   DISCONTD: levothyroxine (EUTHYROX) 88 MCG tablet    Sig: Take 1 tablet (88 mcg total) by mouth daily before breakfast.    Dispense:  90 tablet    Refill:  1    Medications Discontinued During This Encounter  Medication Reason   levothyroxine (EUTHYROX) 100 MCG tablet    levothyroxine (EUTHYROX) 88 MCG tablet     Follow-up: No follow-ups on file.   Crecencio Mc, MD

## 2021-07-23 NOTE — Assessment & Plan Note (Signed)

## 2021-07-23 NOTE — Telephone Encounter (Signed)
Patient called in stating that she is at her pharmacy to pick up her folic acid and she has 30 pills but she is supposed to take 2 per day and is needing her dosage adjusted.Her insurance is requesting a prior authorization for her to take 2 pills per day.Please advise and the patient would like for it to be sent to the [facility] on [address].

## 2021-07-25 NOTE — Telephone Encounter (Signed)
PA has been submitted.

## 2021-07-29 ENCOUNTER — Other Ambulatory Visit: Payer: Self-pay

## 2021-07-29 ENCOUNTER — Other Ambulatory Visit (INDEPENDENT_AMBULATORY_CARE_PROVIDER_SITE_OTHER): Payer: 59

## 2021-07-29 DIAGNOSIS — E039 Hypothyroidism, unspecified: Secondary | ICD-10-CM

## 2021-07-29 LAB — TSH: TSH: 0.55 u[IU]/mL (ref 0.35–5.50)

## 2021-07-31 NOTE — Telephone Encounter (Signed)
Patient calling in to check on results of TSH and if she needs to change her medication. Informed the Patient that the results have came in and that she will get a call back with the results and medication changes once this has been reviewed. Patient verbalized understanding

## 2021-07-31 NOTE — Telephone Encounter (Signed)
Patient calling in to check status of prior authorization

## 2021-08-01 ENCOUNTER — Other Ambulatory Visit: Payer: Self-pay | Admitting: Internal Medicine

## 2021-08-01 ENCOUNTER — Other Ambulatory Visit: Payer: Self-pay | Admitting: Family Medicine

## 2021-08-01 DIAGNOSIS — E039 Hypothyroidism, unspecified: Secondary | ICD-10-CM

## 2021-08-01 MED ORDER — LEVOTHYROXINE SODIUM 100 MCG PO TABS: 100.0000 ug | ORAL_TABLET | Freq: Every day | ORAL | 1 refills | Status: DC

## 2021-08-01 NOTE — Telephone Encounter (Signed)
Pt is aware that we received a fax requesting more information and that it was faxed back today. Told pt that we will contact her as soon as we get a response.

## 2021-08-05 NOTE — Telephone Encounter (Signed)
PA approved through 08/01/2022.

## 2021-10-22 DIAGNOSIS — Z79899 Other long term (current) drug therapy: Secondary | ICD-10-CM | POA: Diagnosis not present

## 2021-10-22 DIAGNOSIS — M0579 Rheumatoid arthritis with rheumatoid factor of multiple sites without organ or systems involvement: Secondary | ICD-10-CM | POA: Diagnosis not present

## 2021-12-02 IMAGING — MR MR ANKLE*R* W/O CM
5 series · 40 of 40 positions shown · non-contrast
Comparison: None

CLINICAL DATA: Injury December 2018, slipped on water in the garage,
lateral ankle pain

EXAM:
MRI OF THE RIGHT ANKLE WITHOUT CONTRAST
TECHNIQUE: Multiplanar, multisequence MR imaging of the ankle was performed. No
intravenous contrast was administered.

[Series 3: PD fat-sat · axial · right · 3.0mm · 0.50mm/px · z∈[-65,+75]mm · 10 of 36 slices shown]
[im 1/36]
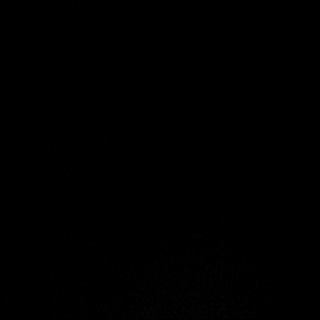
[im 4/36]
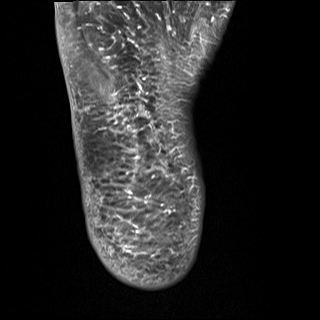
[im 8/36]
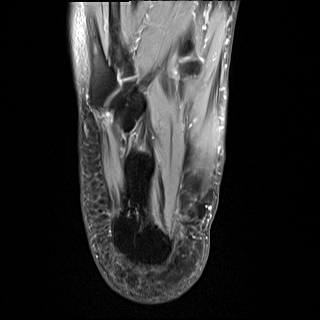
[im 12/36]
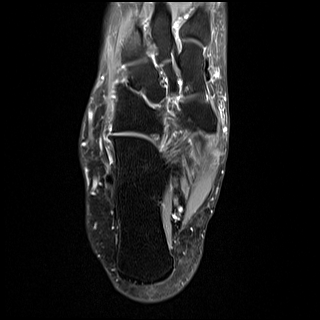
[im 16/36]
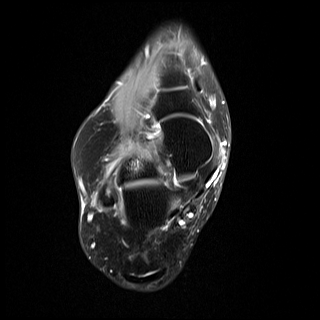
[im 20/36]
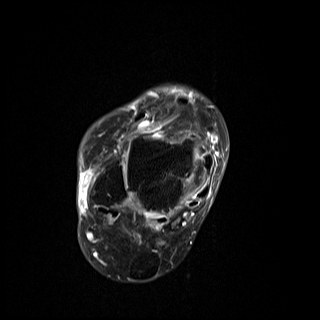
[im 24/36]
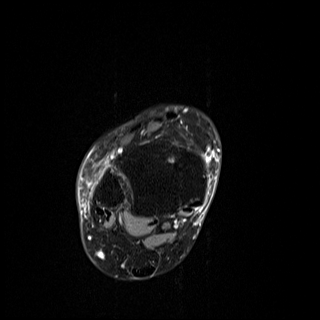
[im 28/36]
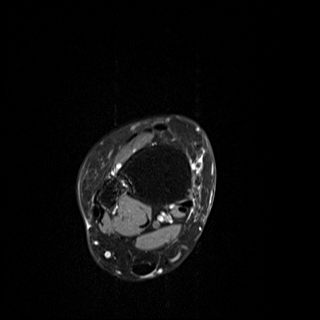
[im 32/36]
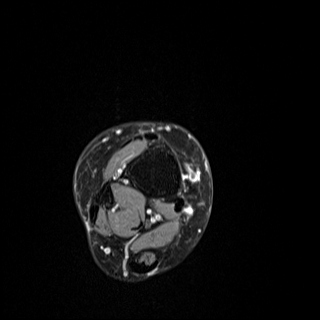
[im 36/36]
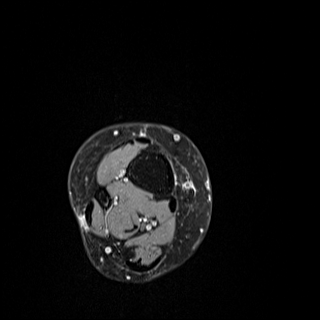

[Series 4: T2 fat-sat · axial · right · 3.0mm · 0.50mm/px · z∈[-65,+75]mm · 10 of 36 slices shown (1 of 2)]
[im 1/36]
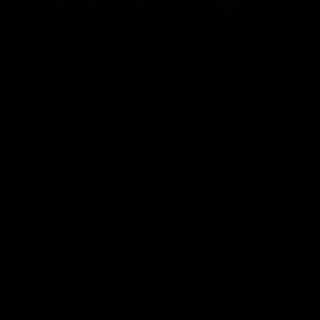
[im 4/36]
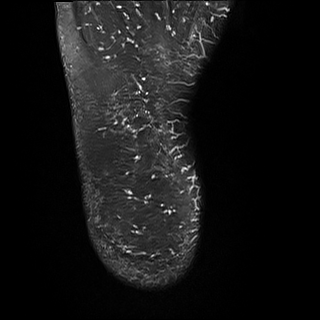
[im 8/36]
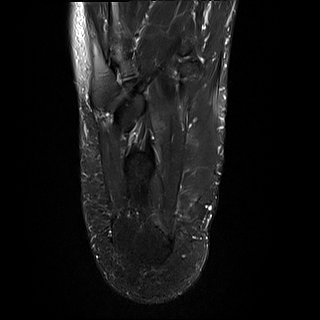
[im 12/36]
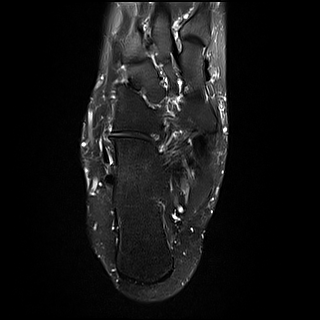
[im 16/36]
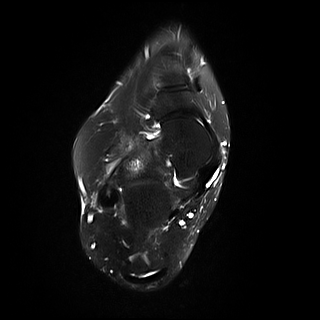
[im 20/36]
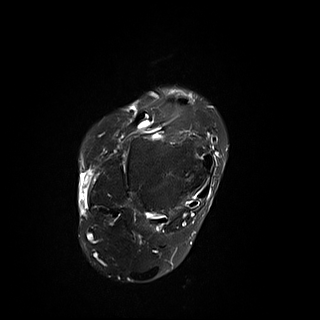
[im 24/36]
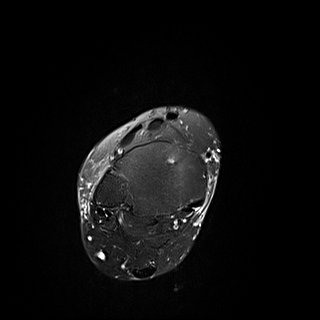
[im 28/36]
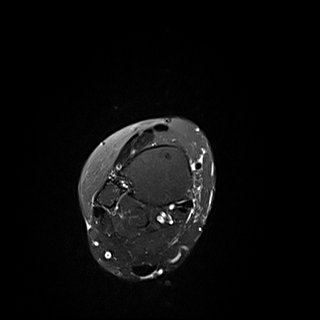
[im 32/36]
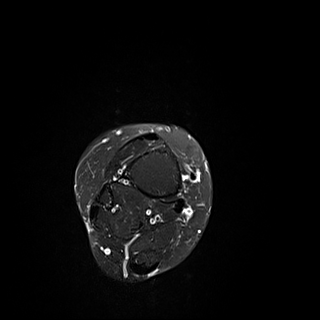
[im 36/36]
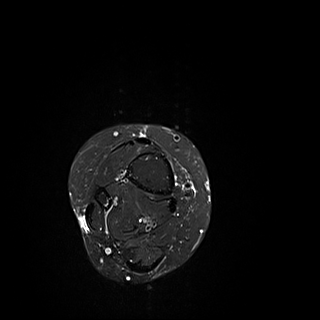

[Series 5: T2 fat-sat · coronal · right · 3.0mm · 0.62mm/px · 10 of 40 slices shown (2 of 2)]
[im 1/40]
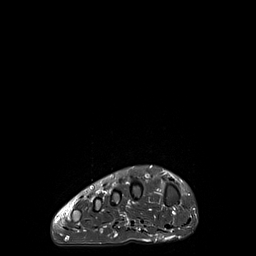
[im 5/40]
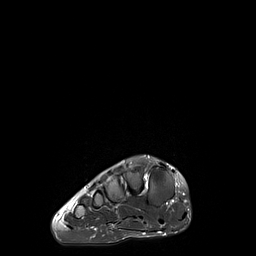
[im 9/40]
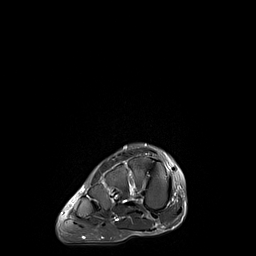
[im 14/40]
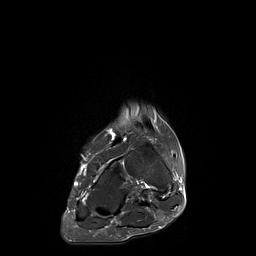
[im 18/40]
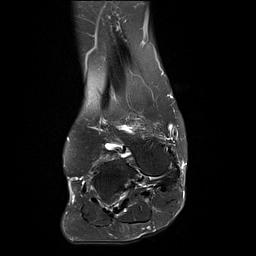
[im 22/40]
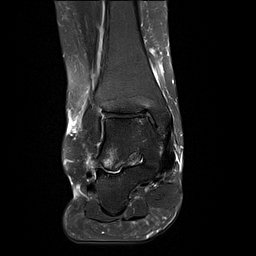
[im 27/40]
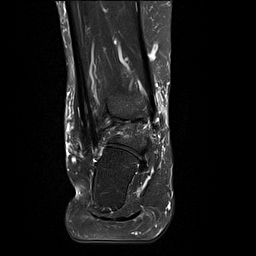
[im 31/40]
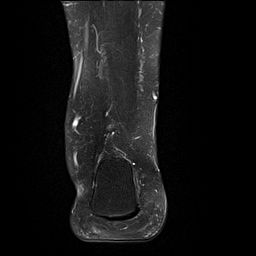
[im 35/40]
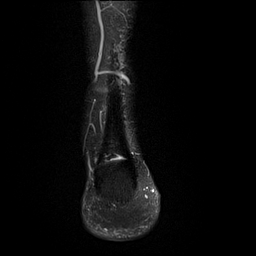
[im 40/40]
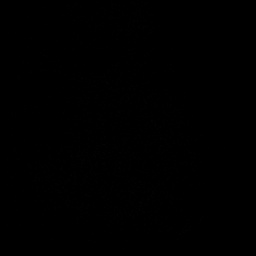

[Series 6: T1 · sagittal · right · 4.0mm · 0.70mm/px · 5 of 21 slices shown]
[im 1/21]
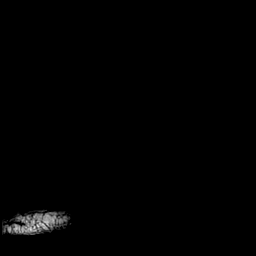
[im 6/21]
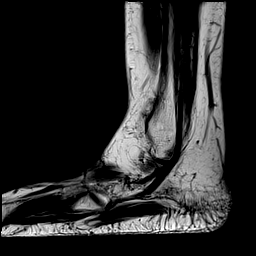
[im 11/21]
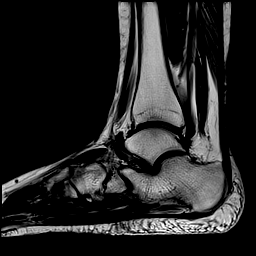
[im 16/21]
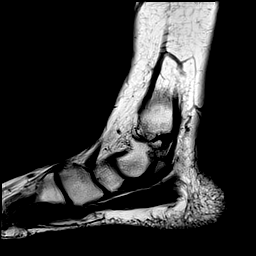
[im 21/21]
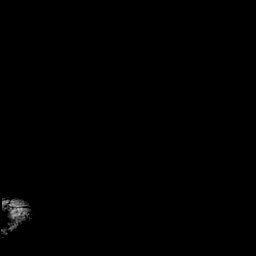

[Series 7: STIR · sagittal · right · 4.0mm · 0.35mm/px · 5 of 21 slices shown]
[im 1/21]
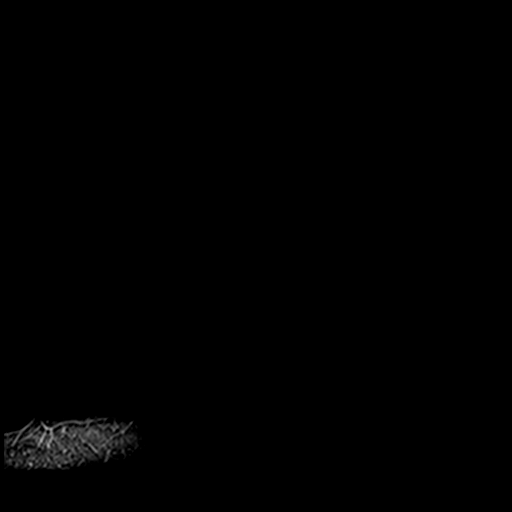
[im 6/21]
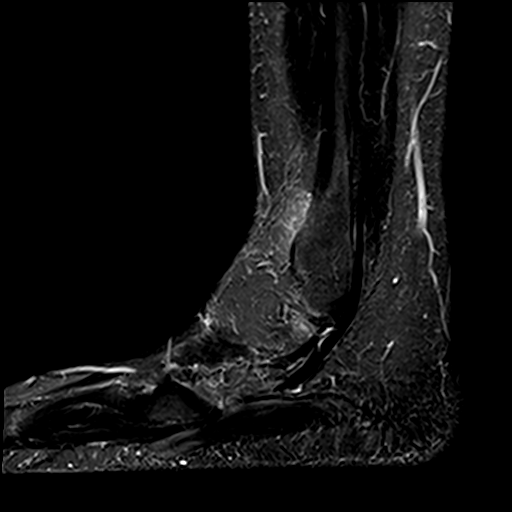
[im 11/21]
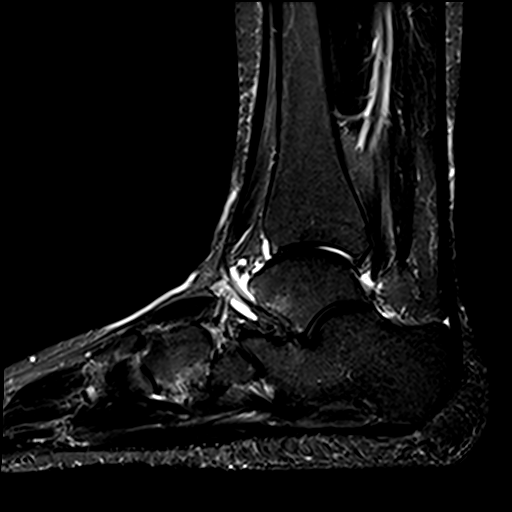
[im 16/21]
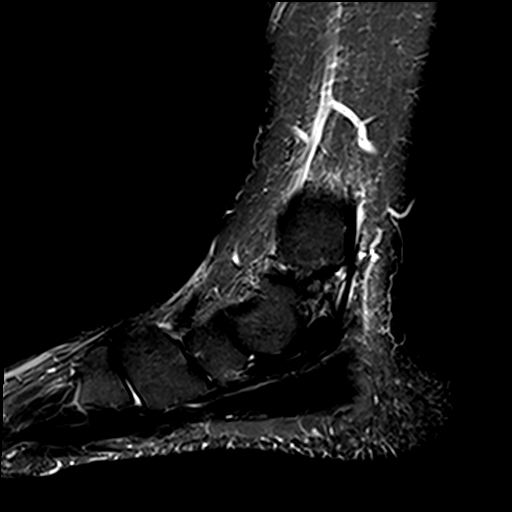
[im 21/21]
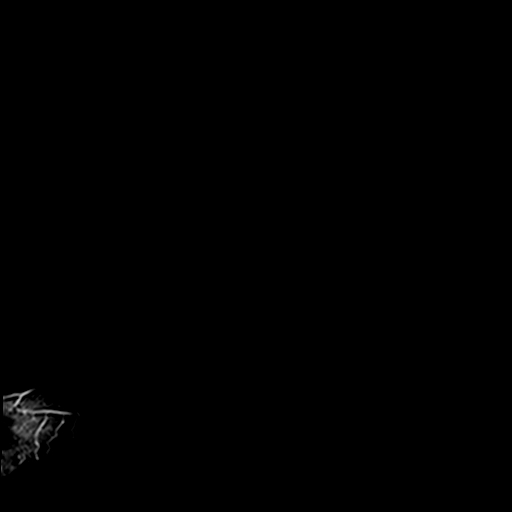

[40 of 40 positions shown; findings below may reference images not displayed]

FINDINGS: TENDONS

Peroneal: Peroneal longus tendon intact. Peroneal brevis intact.

Posteromedial: Posterior tibial tendon intact. Flexor hallucis
longus tendon intact. Flexor digitorum longus tendon intact. A tiny
amount of fluid is seen surrounding the posterior tibialis, flexor
digitorum, flexor hallucis longus tendons.

Anterior: Tibialis anterior tendon intact. Extensor hallucis longus
tendon intact Extensor digitorum longus tendon intact.

Achilles:  Intact.

Plantar Fascia: Mildly increased intrasubstance signal seen within
the middle cord of the plantar fascia.

LIGAMENTS

Lateral: Anterior talofibular ligament intact. Calcaneofibular
ligament intact. Posterior talofibular ligament intact. Anterior and
posterior tibiofibular ligaments intact.

Medial: There is heterogeneous signal seen within the deep fibers of
the deltoid ligament, however it is intact. There is also thickening
seen within the spring ligament, however it is intact.

CARTILAGE

Ankle Joint: There is a trace ankle joint effusion. There is
cortical irregularity seen at the posterior medial talar dome with
underlying subchondral cystic changes, likely from prior
osteochondral injury. This area measures 1.6 cm in AP dimension and
0.6 cm in transverse dimension. There is also joint space loss seen
throughout the medial tibiotalar joint with subchondral marrow
signal changes and anterior osteophyte at the anterior tibia. No
displaced osteochondral fragment however is noted. Subtalar
Joints/Sinus Tarsi: There is subchondral marrow signal changes seen
at the lateral subtalar joint within the talar body and lateral
process. A small amount of fluid and edema seen within the sinus
tarsi.

Bones: No acute fracture or avascular necrosis is noted.

Soft Tissue: No focal soft tissue mass is seen. There is mild soft
tissue swelling seen around the lateral ankle.
IMPRESSION: 1. Findings of a prior chronic osteochondral injury involving the
medial talar dome measuring 1.6 cm in AP dimension 0.6 cm in
transverse dimension with underlying subchondral cystic changes. No
displaced osteochondral fragment however is noted.
2. Medial tibiotalar joint osteoarthritis.
3. Lateral subtalar joint arthrosis and reactive marrow.
4. Edema and fluid within the sinus tarsi which could be due to
sinus tarsi syndrome.
5. Mild posterior tibialis, flexor digitorum, flexor hallucis longus
tenosynovitis
6. Findings suggestive of prior deltoid ligament and spring ligament
sprain

## 2022-01-02 ENCOUNTER — Telehealth: Payer: Self-pay | Admitting: Internal Medicine

## 2022-01-02 NOTE — Telephone Encounter (Signed)
Patient came into office and said that her pharmacy said she needed a prior authorization on her  levothyroxine (EUTHYROX) 100 MCG tablet. ?

## 2022-01-02 NOTE — Telephone Encounter (Signed)
PA has been submitted on covermymeds.  

## 2022-01-09 ENCOUNTER — Telehealth: Payer: Self-pay

## 2022-01-09 DIAGNOSIS — E039 Hypothyroidism, unspecified: Secondary | ICD-10-CM

## 2022-01-09 MED ORDER — LEVOTHYROXINE SODIUM 100 MCG PO TABS: 100.0000 ug | ORAL_TABLET | Freq: Every day | ORAL | 1 refills | Status: DC

## 2022-01-09 NOTE — Telephone Encounter (Signed)
Spoke with [facility] and they needed a new prescription and an okay to change the brand. I have sent in new rx and gave the okay to change brand. I have scheduled pt for a lab appt in 6 weeks to have TSH rechecked. Lab ordered.  ?

## 2022-01-09 NOTE — Telephone Encounter (Signed)
Patient called and wanted to know if the prior authorization has been approved. Please call her.  ?

## 2022-01-16 DIAGNOSIS — M1711 Unilateral primary osteoarthritis, right knee: Secondary | ICD-10-CM | POA: Diagnosis not present

## 2022-01-16 DIAGNOSIS — M25571 Pain in right ankle and joints of right foot: Secondary | ICD-10-CM | POA: Diagnosis not present

## 2022-01-16 DIAGNOSIS — M7122 Synovial cyst of popliteal space [Baker], left knee: Secondary | ICD-10-CM | POA: Diagnosis not present

## 2022-01-16 DIAGNOSIS — M0579 Rheumatoid arthritis with rheumatoid factor of multiple sites without organ or systems involvement: Secondary | ICD-10-CM | POA: Diagnosis not present

## 2022-01-20 ENCOUNTER — Ambulatory Visit: Payer: 59 | Admitting: Dermatology

## 2022-01-20 DIAGNOSIS — L578 Other skin changes due to chronic exposure to nonionizing radiation: Secondary | ICD-10-CM

## 2022-01-20 DIAGNOSIS — L811 Chloasma: Secondary | ICD-10-CM

## 2022-01-20 DIAGNOSIS — L988 Other specified disorders of the skin and subcutaneous tissue: Secondary | ICD-10-CM | POA: Diagnosis not present

## 2022-01-20 NOTE — Progress Notes (Signed)
? ?  New Patient Visit ? ?Subjective  ?Melissa Baker is a 56 y.o. female who presents for the following: New Patient (Initial Visit) (Patient here today concerning dryness, wrinkles, and discoloration at face. Reports she has been using over the counter retinol to help but not improving. ). ? ?The following portions of the chart were reviewed this encounter and updated as appropriate:  ? Tobacco  Allergies  Meds  Problems  Med Hx  Surg Hx  Fam Hx   ?  ?Review of Systems:  No other skin or systemic complaints except as noted in HPI or Assessment and Plan. ? ?Objective  ?Well appearing patient in no apparent distress; mood and affect are within normal limits. ? ?A focused examination was performed including face. Relevant physical exam findings are noted in the Assessment and Plan. ? ?face ?Rhytides and volume loss. ? ? ?face ?Reticulated hyperpigmented patches.  ? ? ? ? ? ? ? ? ? ?Assessment & Plan  ?Elastosis of skin ?face ? ?Discussed tretinion/retinoid would be very irritating due to patient already being dry and irritated and having sensitive skin.   ?Discussed may consider adding a tretinoin and or other retinoid in the fall and may be combined with her hydroquinone skin Lightner. ? ?Recommend not starting retinoid / tretinion can discuss in fall or winter months  ? ?Can continue Retinol OTC product ? ?Recommend Alastin Face Cream or Serum. Information pamphlet given to patient.  ? ?Discussed botox could help with wrinkles at forehead.  Advised that these deep wrinkles caused by muscle action and expression of the face typically cannot be improved or resolved with a topical retinoid but would need a neurotoxin. ? ?Melasma ?face ?Melasma is a chronic condition of persistent pigmented patches generally on the face, worse in summer due to higher UV exposure.  Oral estrogen containing BCPs or supplements can exacerbate condition.  Recommend daily broad spectrum tinted sunscreen SPF 30+ to face, preferably  with Zinc or Titanium Dioxide. Discussed Rx topical bleaching creams (i.e. hydroquinone), OTC HelioCare supplement, chemical peels (would need multiple for best result).  ? ?Will prescribe [facility] Hydroquinone 12%/kojic acid/vitamin C cream pea sized amount twice daily to the entire face for up to 3 months. This cannot be used more than 3 months due to risk of exogenous ochronosis (permanent dark spots). The patient was advised this is not covered by insurance since it is made by a compounding pharmacy. They will receive an email to check out and the medication will be mailed to their home.  ? ?Actinic Damage ?- chronic, secondary to cumulative UV radiation exposure/sun exposure over time ?- diffuse scaly erythematous macules with underlying dyspigmentation ?- Recommend daily broad spectrum sunscreen SPF 30+ to sun-exposed areas, reapply every 2 hours as needed.  ?- Recommend staying in the shade or wearing long sleeves, sun glasses (UVA+UVB protection) and wide brim hats (4-inch brim around the entire circumference of the hat). ?- Call for new or changing lesions. ? ?Return for 6 month follow up on melasma. ? ?I, Ruthell Rummage, CMA, am acting as scribe for Sarina Ser, MD. ?Documentation: I have reviewed the above documentation for accuracy and completeness, and I agree with the above. ? ?Sarina Ser, MD ? ? ?

## 2022-01-20 NOTE — Patient Instructions (Addendum)
Melasma is a chronic condition of persistent pigmented patches generally on the face, worse in summer due to higher UV exposure.  Oral estrogen containing BCPs or supplements can exacerbate condition.  Recommend daily broad spectrum tinted sunscreen SPF 30+ to face, preferably with Zinc or Titanium Dioxide. Discussed Rx topical bleaching creams (i.e. hydroquinone), OTC HelioCare supplement, chemical peels (would need multiple for best result).  ? ?Will prescribe [facility] Hydroquinone 12%/kojic acid/vitamin C cream pea sized amount twice daily to the entire face for up to 3 months. This cannot be used more than 3 months due to risk of exogenous ochronosis (permanent dark spots). The patient was advised this is not covered by insurance since it is made by a compounding pharmacy. They will receive an email to check out and the medication will be mailed to their home.  ? ?Instructions for [facility] Medications ? ?One or more of your medications was sent to the [facility] mail order compounding pharmacy. You will receive an email from them and can purchase the medicine through that link. It will then be mailed to your home at the address you confirmed. If for any reason you do not receive an email from them, please check your spam folder. If you still do not find the email, please let us know. [facility] phone number is [phone].  ? ?Recommend Elta MD sunscreen  ?Recommend daily broad spectrum sunscreen SPF 30+ to sun-exposed areas, reapply every 2 hours as needed. Call for new or changing lesions.  ?Staying in the shade or wearing long sleeves, sun glasses (UVA+UVB protection) and wide brim hats (4-inch brim around the entire circumference of the hat) are also recommended for sun protection.   ? ?Recommend taking Heliocare sun protection supplement daily in sunny weather for additional sun protection. For maximum protection on the sunniest days, you can take up to 2 capsules of regular  Heliocare OR take 1 capsule of Heliocare Ultra. For prolonged exposure (such as a full day in the sun), you can repeat your dose of the supplement 4 hours after your first dose. Heliocare can be purchased at [employer], at some [facility] or at [email or web address].   ? ?Recommend Alastin Face serum or cream for a good face moisturizer  ? ? ?If You Need Anything After Your Visit ? ?If you have any questions or concerns for your doctor, please call our main line at [phone] and press option 4 to reach your doctor's medical assistant. If no one answers, please leave a voicemail as directed and we will return your call as soon as possible. Messages left after 4 pm will be answered the following business day.  ? ?You may also send [place] a message via MyChart. We typically respond to MyChart messages within 1-2 business days. ? ?For prescription refills, please ask your pharmacy to contact our office. Our fax number is [phone]. ? ?If you have an urgent issue when the clinic is closed that cannot wait until the next business day, you can page your doctor at the number below.   ? ?Please note that while we do our best to be available for urgent issues outside of office hours, we are not available 24/7.  ? ?If you have an urgent issue and are unable to reach [place], you may choose to seek medical care at your doctor's office, retail clinic, urgent care center, or emergency room. ? ?If you have a medical emergency, please immediately call 911 or go to the emergency department. ? ?  Pager Numbers ? ?- Dr. Nehemiah Massed: [phone] ? ?- Dr. Laurence Ferrari: [phone] ? ?- Dr. Nicole Kindred: [phone] ? ?In the event of inclement weather, please call our main line at [phone] for an update on the status of any delays or closures. ? ?Dermatology Medication Tips: ?Please keep the boxes that topical medications come in in order to help keep track of the instructions about where and how to use these. Pharmacies typically print the  medication instructions only on the boxes and not directly on the medication tubes.  ? ?If your medication is too expensive, please contact our office at [phone] option 4 or send [place] a message through [place].  ? ?We are unable to tell what your co-pay for medications will be in advance as this is different depending on your insurance coverage. However, we may be able to find a substitute medication at lower cost or fill out paperwork to get insurance to cover a needed medication.  ? ?If a prior authorization is required to get your medication covered by your insurance company, please allow [place] 1-2 business days to complete this process. ? ?Drug prices often vary depending on where the prescription is filled and some pharmacies may offer cheaper prices. ? ?The website www.goodrx.com contains coupons for medications through different pharmacies. The prices here do not account for what the cost may be with help from insurance (it may be cheaper with your insurance), but the website can give you the price if you did not use any insurance.  ?- You can print the associated coupon and take it with your prescription to the pharmacy.  ?- You may also stop by our office during regular business hours and pick up a GoodRx coupon card.  ?- If you need your prescription sent electronically to a different pharmacy, notify our office through [facility] or by phone at [phone] option 4. ? ? ? ? ?Si Usted Necesita Algo Despu?s de Su Visita ? ?Tambi?n puede enviarnos un mensaje a trav?s de MyChart. Por lo general respondemos a los mensajes de MyChart en el transcurso de 1 a 2 d?as h?biles. ? ?Para renovar recetas, por favor pida a su farmacia que se ponga en contacto con nuestra oficina. Nuestro n?mero de fax es el [phone]. ? ?Si tiene un asunto urgente cuando la cl?nica est? cerrada y que no puede esperar hasta el siguiente d?a h?bil, puede llamar/localizar a su doctor(a) al n?mero que aparece a continuaci?n.   ? ?Por favor, tenga en cuenta que aunque hacemos todo lo posible para estar disponibles para asuntos urgentes fuera del horario de oficina, no estamos disponibles las 24 horas del d?a, los 7 d?as de la semana.  ? ?Si tiene un problema urgente y no puede comunicarse con nosotros, puede optar por buscar atenci?n m?dica  en el consultorio de su doctor(a), en una cl?nica privada, en un centro de atenci?n urgente o en una sala de emergencias. ? ?Si tiene Engineer, maintenance (IT) m?dica, por favor llame inmediatamente al 911 o vaya a la sala de emergencias. ? ?N?meros de b?per ? ?- Dr. Nehemiah Massed: [phone] ? ?- Dra. Moye: [phone] ? ?- Dra. Nicole Kindred: [phone] ? ?En caso de inclemencias del tiempo, por favor llame a nuestra l?nea principal al [phone] para una actualizaci?n sobre el estado de cualquier retraso o cierre. ? ?Consejos para la medicaci?n en dermatolog?a: ?Por favor, guarde las cajas en las que vienen los medicamentos de uso t?pico para ayudarle a seguir las instrucciones sobre d?nde y c?mo usarlos. Las farmacias generalmente imprimen  las instrucciones del medicamento s?lo en las cajas y no directamente en los tubos del [place].  ? ?Si su medicamento es muy caro, por favor, p?ngase en contacto con Zigmund Daniel llamando al [phone] y presione la opci?n 4 o env?enos un mensaje a trav?s de MyChart.  ? ?No podemos decirle cu?l ser? su copago por los medicamentos por adelantado ya que esto es diferente dependiendo de la cobertura de su seguro. Sin embargo, es posible que podamos encontrar un medicamento sustituto a Electrical engineer un formulario para que el seguro cubra el medicamento que se considera necesario.  ? ?Si se requiere Ardelia Mems autorizaci?n previa para que su compa??a de seguros [place] su medicamento, por favor perm?tanos de 1 a 2 d?as h?biles para completar este proceso. ? ?Los precios de los medicamentos var?an con frecuencia dependiendo del Environmental consultant de d?nde se surte la receta y alguna  farmacias pueden ofrecer precios m?s baratos. ? ?El sitio web www.goodrx.com tiene cupones para medicamentos de Airline pilot. Los precios aqu? no tienen en cuenta lo que podr?a costar con la ayuda del seguro (p

## 2022-01-21 ENCOUNTER — Encounter: Payer: Self-pay | Admitting: Dermatology

## 2022-02-21 ENCOUNTER — Other Ambulatory Visit (INDEPENDENT_AMBULATORY_CARE_PROVIDER_SITE_OTHER): Payer: 59

## 2022-02-21 DIAGNOSIS — E039 Hypothyroidism, unspecified: Secondary | ICD-10-CM | POA: Diagnosis not present

## 2022-02-21 LAB — TSH: TSH: 1.3 u[IU]/mL (ref 0.35–5.50)

## 2022-03-25 DIAGNOSIS — M1711 Unilateral primary osteoarthritis, right knee: Secondary | ICD-10-CM | POA: Diagnosis not present

## 2022-03-25 DIAGNOSIS — M170 Bilateral primary osteoarthritis of knee: Secondary | ICD-10-CM | POA: Diagnosis not present

## 2022-03-25 DIAGNOSIS — M1712 Unilateral primary osteoarthritis, left knee: Secondary | ICD-10-CM | POA: Diagnosis not present

## 2022-05-19 DIAGNOSIS — M170 Bilateral primary osteoarthritis of knee: Secondary | ICD-10-CM | POA: Diagnosis not present

## 2022-05-19 DIAGNOSIS — M25571 Pain in right ankle and joints of right foot: Secondary | ICD-10-CM | POA: Diagnosis not present

## 2022-05-19 DIAGNOSIS — G8929 Other chronic pain: Secondary | ICD-10-CM | POA: Diagnosis not present

## 2022-05-19 DIAGNOSIS — M069 Rheumatoid arthritis, unspecified: Secondary | ICD-10-CM | POA: Diagnosis not present

## 2022-05-19 DIAGNOSIS — Z79899 Other long term (current) drug therapy: Secondary | ICD-10-CM | POA: Diagnosis not present

## 2022-05-19 DIAGNOSIS — M0579 Rheumatoid arthritis with rheumatoid factor of multiple sites without organ or systems involvement: Secondary | ICD-10-CM | POA: Diagnosis not present

## 2022-05-28 ENCOUNTER — Telehealth: Payer: Self-pay | Admitting: Internal Medicine

## 2022-05-28 DIAGNOSIS — J019 Acute sinusitis, unspecified: Secondary | ICD-10-CM | POA: Diagnosis not present

## 2022-05-28 DIAGNOSIS — U071 COVID-19: Secondary | ICD-10-CM | POA: Diagnosis not present

## 2022-05-28 DIAGNOSIS — B9689 Other specified bacterial agents as the cause of diseases classified elsewhere: Secondary | ICD-10-CM | POA: Diagnosis not present

## 2022-05-28 DIAGNOSIS — J209 Acute bronchitis, unspecified: Secondary | ICD-10-CM | POA: Diagnosis not present

## 2022-05-28 DIAGNOSIS — Z03818 Encounter for observation for suspected exposure to other biological agents ruled out: Secondary | ICD-10-CM | POA: Diagnosis not present

## 2022-05-28 NOTE — Telephone Encounter (Signed)
Patient called back stating that the nurse instructed her to call the office back and schedule a visit today.  We did not have any available appointments today.  Patient stated she would try Urgent care and I was able to schedule an appt tomorrow with Dr Lorelei Pont just in case she did not make it to the urgent care today, per pt request.

## 2022-05-28 NOTE — Telephone Encounter (Signed)
Noted. Scheduled with Dr. Lorelei Pont

## 2022-05-28 NOTE — Telephone Encounter (Signed)
Patient called complaining of sore throat, shortness of breath, dizziness, fever (low grade), cough and restlessness.  Patient was transferred to Access Nurse: Marzetta Board

## 2022-05-29 ENCOUNTER — Ambulatory Visit: Payer: 59 | Admitting: Family Medicine

## 2022-05-29 NOTE — Progress Notes (Deleted)
    [name] T. Kiam Bransfield, MD, [place] [facility] [place], [address]  Phone: [phone]  FAX: [phone]  Melissa Baker - 56 y.o. female  MRN [id number]  Date of Birth: 1966-02-14  Date: 05/29/2022  PCP: Crecencio Mc, MD  Referral: Crecencio Mc, MD  No chief complaint on file.  Subjective:   Melissa Baker is a 56 y.o. very pleasant female patient with There is no height or weight on file to calculate BMI. who presents with the following:  She is a patient of Dr. Derrel Nip at the [facility] office who presents for an acute illness work-in.    Review of Systems is noted in the HPI, as appropriate  Objective:   There were no vitals taken for this visit.  GEN: No acute distress; alert,appropriate. PULM: Breathing comfortably in no respiratory distress PSYCH: Normally interactive.   Laboratory and Imaging Data:  Assessment and Plan:   ***

## 2022-06-10 DIAGNOSIS — M170 Bilateral primary osteoarthritis of knee: Secondary | ICD-10-CM | POA: Diagnosis not present

## 2022-06-12 DIAGNOSIS — M19071 Primary osteoarthritis, right ankle and foot: Secondary | ICD-10-CM | POA: Diagnosis not present

## 2022-06-12 DIAGNOSIS — M958 Other specified acquired deformities of musculoskeletal system: Secondary | ICD-10-CM | POA: Diagnosis not present

## 2022-06-12 DIAGNOSIS — M0579 Rheumatoid arthritis with rheumatoid factor of multiple sites without organ or systems involvement: Secondary | ICD-10-CM | POA: Diagnosis not present

## 2022-06-18 DIAGNOSIS — M170 Bilateral primary osteoarthritis of knee: Secondary | ICD-10-CM | POA: Diagnosis not present

## 2022-06-20 DIAGNOSIS — M170 Bilateral primary osteoarthritis of knee: Secondary | ICD-10-CM | POA: Diagnosis not present

## 2022-06-25 DIAGNOSIS — M170 Bilateral primary osteoarthritis of knee: Secondary | ICD-10-CM | POA: Diagnosis not present

## 2022-06-30 DIAGNOSIS — M170 Bilateral primary osteoarthritis of knee: Secondary | ICD-10-CM | POA: Diagnosis not present

## 2022-07-03 DIAGNOSIS — M19071 Primary osteoarthritis, right ankle and foot: Secondary | ICD-10-CM | POA: Diagnosis not present

## 2022-07-03 DIAGNOSIS — M958 Other specified acquired deformities of musculoskeletal system: Secondary | ICD-10-CM | POA: Diagnosis not present

## 2022-07-09 DIAGNOSIS — M170 Bilateral primary osteoarthritis of knee: Secondary | ICD-10-CM | POA: Diagnosis not present

## 2022-07-14 DIAGNOSIS — M170 Bilateral primary osteoarthritis of knee: Secondary | ICD-10-CM | POA: Diagnosis not present

## 2022-07-18 DIAGNOSIS — M170 Bilateral primary osteoarthritis of knee: Secondary | ICD-10-CM | POA: Diagnosis not present

## 2022-07-19 ENCOUNTER — Other Ambulatory Visit: Payer: Self-pay | Admitting: Internal Medicine

## 2022-07-19 DIAGNOSIS — E039 Hypothyroidism, unspecified: Secondary | ICD-10-CM

## 2022-07-20 ENCOUNTER — Encounter: Payer: Self-pay | Admitting: Internal Medicine

## 2022-07-20 DIAGNOSIS — E039 Hypothyroidism, unspecified: Secondary | ICD-10-CM

## 2022-07-21 ENCOUNTER — Other Ambulatory Visit: Payer: Self-pay | Admitting: Rheumatology

## 2022-07-21 ENCOUNTER — Ambulatory Visit
Admission: RE | Admit: 2022-07-21 | Discharge: 2022-07-21 | Disposition: A | Discharge status: Home / Self Care | Payer: 59 | Source: Ambulatory Visit | Attending: Rheumatology | Admitting: Rheumatology

## 2022-07-21 DIAGNOSIS — M1712 Unilateral primary osteoarthritis, left knee: Secondary | ICD-10-CM | POA: Diagnosis not present

## 2022-07-21 DIAGNOSIS — M0579 Rheumatoid arthritis with rheumatoid factor of multiple sites without organ or systems involvement: Secondary | ICD-10-CM | POA: Diagnosis not present

## 2022-07-21 DIAGNOSIS — Z79899 Other long term (current) drug therapy: Secondary | ICD-10-CM | POA: Diagnosis not present

## 2022-07-21 DIAGNOSIS — M25562 Pain in left knee: Secondary | ICD-10-CM

## 2022-07-21 MED ORDER — LEVOTHYROXINE SODIUM 100 MCG PO TABS: ORAL_TABLET | ORAL | 0 refills | Status: DC

## 2022-07-25 ENCOUNTER — Ambulatory Visit (INDEPENDENT_AMBULATORY_CARE_PROVIDER_SITE_OTHER): Payer: 59 | Admitting: Internal Medicine

## 2022-07-25 ENCOUNTER — Telehealth: Payer: Self-pay | Admitting: Internal Medicine

## 2022-07-25 ENCOUNTER — Encounter: Payer: Self-pay | Admitting: Internal Medicine

## 2022-07-25 ENCOUNTER — Other Ambulatory Visit (HOSPITAL_COMMUNITY)
Admission: RE | Admit: 2022-07-25 | Discharge: 2022-07-25 | Disposition: A | Discharge status: Home / Self Care | Payer: 59 | Source: Ambulatory Visit | Attending: Internal Medicine | Admitting: Internal Medicine

## 2022-07-25 VITALS — BP 128/72 | HR 79 | Temp 98.7°F | Resp 12 | Wt 172.6 lb

## 2022-07-25 DIAGNOSIS — E559 Vitamin D deficiency, unspecified: Secondary | ICD-10-CM | POA: Diagnosis not present

## 2022-07-25 DIAGNOSIS — M0579 Rheumatoid arthritis with rheumatoid factor of multiple sites without organ or systems involvement: Secondary | ICD-10-CM | POA: Diagnosis not present

## 2022-07-25 DIAGNOSIS — E785 Hyperlipidemia, unspecified: Secondary | ICD-10-CM

## 2022-07-25 DIAGNOSIS — Z1231 Encounter for screening mammogram for malignant neoplasm of breast: Secondary | ICD-10-CM

## 2022-07-25 DIAGNOSIS — Z114 Encounter for screening for human immunodeficiency virus [HIV]: Secondary | ICD-10-CM

## 2022-07-25 DIAGNOSIS — Z1159 Encounter for screening for other viral diseases: Secondary | ICD-10-CM | POA: Diagnosis not present

## 2022-07-25 DIAGNOSIS — Z0000 Encounter for general adult medical examination without abnormal findings: Secondary | ICD-10-CM

## 2022-07-25 DIAGNOSIS — Z1211 Encounter for screening for malignant neoplasm of colon: Secondary | ICD-10-CM | POA: Diagnosis not present

## 2022-07-25 DIAGNOSIS — E039 Hypothyroidism, unspecified: Secondary | ICD-10-CM

## 2022-07-25 DIAGNOSIS — Z79899 Other long term (current) drug therapy: Secondary | ICD-10-CM | POA: Diagnosis not present

## 2022-07-25 DIAGNOSIS — N950 Postmenopausal bleeding: Secondary | ICD-10-CM | POA: Insufficient documentation

## 2022-07-25 DIAGNOSIS — Z23 Encounter for immunization: Secondary | ICD-10-CM

## 2022-07-25 DIAGNOSIS — R69 Illness, unspecified: Secondary | ICD-10-CM | POA: Diagnosis not present

## 2022-07-25 DIAGNOSIS — Z124 Encounter for screening for malignant neoplasm of cervix: Secondary | ICD-10-CM | POA: Insufficient documentation

## 2022-07-25 DIAGNOSIS — M25562 Pain in left knee: Secondary | ICD-10-CM | POA: Diagnosis not present

## 2022-07-25 DIAGNOSIS — R7301 Impaired fasting glucose: Secondary | ICD-10-CM

## 2022-07-25 LAB — COMPREHENSIVE METABOLIC PANEL
ALT: 17 U/L (ref 0–35)
AST: 16 U/L (ref 0–37)
Albumin: 4.6 g/dL (ref 3.5–5.2)
Alkaline Phosphatase: 62 U/L (ref 39–117)
BUN: 15 mg/dL (ref 6–23)
CO2: 26 mEq/L (ref 19–32)
Calcium: 9.8 mg/dL (ref 8.4–10.5)
Chloride: 103 mEq/L (ref 96–112)
Creatinine, Ser: 0.61 mg/dL (ref 0.40–1.20)
GFR: 98.73 mL/min (ref >60.00)
Glucose, Bld: 94 mg/dL (ref 70–99)
Potassium: 3.8 mEq/L (ref 3.5–5.1)
Sodium: 137 mEq/L (ref 135–145)
Total Bilirubin: 0.4 mg/dL (ref 0.2–1.2)
Total Protein: 7.5 g/dL (ref 6.0–8.3)

## 2022-07-25 LAB — LIPID PANEL
Cholesterol: 264 mg/dL — ABNORMAL HIGH (ref 0–200)
HDL: 62.2 mg/dL (ref >39.00)
NonHDL: 201.3
Total CHOL/HDL Ratio: 4
Triglycerides: 345 mg/dL — ABNORMAL HIGH (ref 0.0–149.0)
VLDL: 69 mg/dL — ABNORMAL HIGH (ref 0.0–40.0)

## 2022-07-25 LAB — VITAMIN D 25 HYDROXY (VIT D DEFICIENCY, FRACTURES): VITD: 26.27 ng/mL — ABNORMAL LOW (ref 30.00–100.00)

## 2022-07-25 LAB — LDL CHOLESTEROL, DIRECT: Direct LDL: 168 mg/dL

## 2022-07-25 LAB — HEMOGLOBIN A1C: Hgb A1c MFr Bld: 5.6 % (ref 4.6–6.5)

## 2022-07-25 MED ORDER — PREDNISONE 10 MG PO TABS: ORAL_TABLET | ORAL | 0 refills | Status: DC

## 2022-07-25 MED ORDER — TRAMADOL HCL 50 MG PO TABS: 50.0000 mg | ORAL_TABLET | Freq: Three times a day (TID) | ORAL | 5 refills | Status: AC | PRN

## 2022-07-25 NOTE — Progress Notes (Signed)
The patient is here for annual preventive examination and management of other chronic and acute problems.   The risk factors are reflected in the social history.   The roster of all physicians providing medical care to patient - is listed in the Snapshot section of the chart.   Activities of daily living:  The patient is 100% independent in all ADLs: dressing, toileting, feeding as well as independent mobility   Home safety : The patient has smoke detectors in the home. They wear seatbelts.  There are no unsecured firearms at home. There is no violence in the home.    There is no risks for hepatitis, STDs or HIV. There is no   history of blood transfusion. They have no travel history to infectious disease endemic areas of the world.   The patient has seen their dentist in the last six month. They have seen their eye doctor in the last year. The patinet  denies slight hearing difficulty with regard to whispered voices and some television programs.  They have deferred audiologic testing in the last year.  They do not  have excessive sun exposure. Discussed the need for sun protection: hats, long sleeves and use of sunscreen if there is significant sun exposure.    Diet: the importance of a healthy diet is discussed. They do have a healthy diet.   The benefits of regular aerobic exercise were discussed. The patient  is not exercising due to knee pain .    Depression screen: there are no signs or vegative symptoms of depression- irritability, change in appetite, anhedonia, sadness/tearfullness.   The following portions of the patient's history were reviewed and updated as appropriate: allergies, current medications, past family history, past medical history,  past surgical history, past social history  and problem list.   Visual acuity was not assessed per patient preference since the patient has regular follow up with an  ophthalmologist. Hearing and body mass index were assessed and reviewed.     During the course of the visit the patient was educated and counseled about appropriate screening and preventive services including : fall prevention , diabetes screening, nutrition counseling, colorectal cancer screening, and recommended immunizations.    Chief Complaint:  1) Rheumatoid arthritis:  previously managed by Jefm Bryant  now assigned to Dafoor  the PA  , was seen on Oct 2. Not confident in his abilities.  Wants to see an MD   2)  ACUTE LEFT KNEE PAIN .  Started on Saturday after participating in PT on Friday .  Was worked in by [employer], [place]  on Monday Oct 2 .  X rays were reportedly normal; she was given tramadol, no steroids.  Was referred for PT but feels that the PT was substandard ,  it was not done one on one  but with several patients to one therapists.  She states that she was treated with some  form of laser therapy directed at her posterior fossa and then directed to  ride a stationery bike .  She is scheduled to see  Dr.  Marry Guan in December   3) Post menopausal bleeding: she had an episode of vaginal bleeding   described as spotting,  for several days.  The symptoms felt like a menstrual period.    Review of Symptoms  Patient denies headache, fevers, malaise, unintentional weight loss, skin rash, eye pain, sinus congestion and sinus pain, sore throat, dysphagia,  hemoptysis , cough, dyspnea, wheezing, chest pain, palpitations, orthopnea, edema, abdominal pain,  nausea, melena, diarrhea, constipation, flank pain, dysuria, hematuria, urinary  Frequency, nocturia, numbness, tingling, seizures,  Focal weakness, Loss of consciousness,  Tremor, insomnia, depression, anxiety, and suicidal ideation.    Physical Exam:  BP 128/72   Pulse 79   Temp 98.7 F (37.1 C)   Resp 12   Wt 172 lb 9.6 oz (78.3 kg)   SpO2 97%   BMI 27.03 kg/m     General Appearance:    Alert, cooperative, no distress, appears stated age  Head:    Normocephalic, without obvious abnormality, atraumatic  Eyes:     PERRL, conjunctiva/corneas clear, EOM's intact, fundi    benign, both eyes  Ears:    Normal TM's and external ear canals, both ears  Nose:   Nares normal, septum midline, mucosa normal, no drainage    or sinus tenderness  Throat:   Lips, mucosa, and tongue normal; teeth and gums normal  Neck:   Supple, symmetrical, trachea midline, no adenopathy;    thyroid:  no enlargement/tenderness/nodules; no carotid   bruit or JVD  Back:     Symmetric, no curvature, ROM normal, no CVA tenderness  Lungs:     Clear to auscultation bilaterally, respirations unlabored  Chest Wall:    No tenderness or deformity   Heart:    Regular rate and rhythm, S1 and S2 normal, no murmur, rub   or gallop  Breast Exam:    No tenderness, masses, or nipple abnormality  Abdomen:     Soft, non-tender, bowel sounds active all four quadrants,    no masses, no organomegaly  Genitalia:    Pelvic: cervix normal in appearance, external genitalia normal, no adnexal masses or tenderness, no cervical motion tenderness, rectovaginal septum normal, uterus normal size, shape, and consistency and vagina normal without discharge  Extremities:   Extremities normal, atraumatic, no cyanosis or edema  Pulses:   2+ and symmetric all extremities  Skin:   Skin color, texture, turgor normal, no rashes or lesions  Lymph nodes:   Cervical, supraclavicular, and axillary nodes normal  Neurologic:   CNII-XII intact, normal strength, sensation and reflexes    throughout    Assessment and Plan:  Encounter for preventive health examination age appropriate education and counseling updated, referrals for preventative services and immunizations addressed, dietary and smoking counseling addressed, most recent labs reviewed.  I have personally reviewed and have noted:   1) the patient's medical and social history 2) The pt's use of alcohol, tobacco, and illicit drugs 3) The patient's current medications and supplements 4) Functional ability  including ADL's, fall risk, home safety risk, hearing and visual impairment 5) Diet and physical activities 6) Evidence for depression or mood disorder 7) The patient's height, weight, and BMI have been recorded in the chart  I have made referrals, and provided counseling and education based on review of the above  Hyperlipidemia LDL goal <130 Current risk is 8.5%  But has RA  .  We have discussed risks s benefits of statin. She remains contemplative due to recurrent myalgias and the concern that statin therapy may aggravate her pain . She has a family history of CAD, but not  early.  Lab Results  Component Value Date   CHOL 264 (H) 07/25/2022   HDL 62.20 07/25/2022   LDLCALC 150 (H) 07/19/2021   LDLDIRECT 168.0 07/25/2022   TRIG 345.0 (H) 07/25/2022   CHOLHDL 4 07/25/2022      Hypothyroidism Thyroid function is WNL on current dose.  No current  changes needed.   Lab Results  Component Value Date   TSH 1.30 02/21/2022     Post-menopausal bleeding Occurred 3 months ago, unprovoked by sexual intercourse. Marland Kitchen  PAP smear done today.  Referral to Dr Garwin Brothers for endometrial evaluation   Acute pain of left knee Episodic ,  With recent episode now resolved.  No imaging studies have been done.  No prednisone was given for last flare despite an active diagnosis of of rheumatoid arthritis prednisone taper and tramadol prescribed    Updated Medication List Outpatient Encounter Medications as of 07/25/2022  Medication Sig   predniSONE (DELTASONE) 10 MG tablet 6 tablets on Day 1 , then reduce by 1 tablet daily until gone   traMADol (ULTRAM) 50 MG tablet Take 1 tablet (50 mg total) by mouth every 8 (eight) hours as needed for up to 7 days.   Biotin 1000 MCG tablet Take 1,000 mcg by mouth daily.   calcium carbonate (OS-CAL) 600 MG TABS tablet Take 600 mg by mouth daily with breakfast.   cholecalciferol (VITAMIN D3) 25 MCG (1000 UNIT) tablet Take 1,000 Units by mouth daily.   diclofenac Sodium  (VOLTAREN) 1 % GEL Apply 2 g topically 2 (two) times daily.   folic acid (FOLVITE) 1 MG tablet Take 2 tablets (2 mg total) by mouth daily.   HUMIRA PEN 40 MG/0.4ML PNKT SMARTSIG:40 Milligram(s) SUB-Q Every 2 Weeks   levothyroxine (SYNTHROID) 100 MCG tablet TAKE 1 TABLET BY MOUTH ONCE DAILY IN THE MORNING BEFORE BREAKFAST   methotrexate (RHEUMATREX) 2.5 MG tablet TAKE 8 TABLETS BY MOUTH EVERY 7 DAYS   sulindac (CLINORIL) 200 MG tablet Take 200 mg by mouth 2 (two) times daily.   No facility-administered encounter medications on file as of 07/25/2022.

## 2022-07-25 NOTE — Telephone Encounter (Signed)
Patient requesting fasting labs to be done before physical 07/27/2023. She has been scheduled for the labs needing orders.

## 2022-07-25 NOTE — Patient Instructions (Signed)
I have refilled the tramadol to use up to every 6 hours for next episode of knee pain , along with a prednisone taper  Your annual mammogram has been ordered .  [facility] will not allow [place] to schedule it for you,  so please  call to make your appointment [phone]    I have referred yo u to Dr Servando Salina in [facility] for endometrial evaluation

## 2022-07-25 NOTE — Assessment & Plan Note (Signed)

## 2022-07-27 ENCOUNTER — Encounter: Payer: Self-pay | Admitting: Internal Medicine

## 2022-07-27 NOTE — Assessment & Plan Note (Signed)
Occurred 3 months ago, unprovoked by sexual intercourse. Marland Kitchen  PAP smear done today.  Referral to Dr Garwin Brothers for endometrial evaluation

## 2022-07-27 NOTE — Assessment & Plan Note (Signed)
Current risk is 8.5%  But has RA  .  We have discussed risks s benefits of statin. She remains contemplative due to recurrent myalgias and the concern that statin therapy may aggravate her pain . She has a family history of CAD, but not  early.  Lab Results  Component Value Date   CHOL 264 (H) 07/25/2022   HDL 62.20 07/25/2022   LDLCALC 150 (H) 07/19/2021   LDLDIRECT 168.0 07/25/2022   TRIG 345.0 (H) 07/25/2022   CHOLHDL 4 07/25/2022

## 2022-07-27 NOTE — Assessment & Plan Note (Signed)
Thyroid function is WNL on current dose.  No current changes needed.   Lab Results  Component Value Date   TSH 1.30 02/21/2022

## 2022-07-27 NOTE — Assessment & Plan Note (Signed)
Episodic ,  With recent episode now resolved.  No imaging studies have been done.  No prednisone was given for last flare despite an active diagnosis of of rheumatoid arthritis prednisone taper and tramadol prescribed

## 2022-07-28 ENCOUNTER — Ambulatory Visit: Payer: 59 | Admitting: Dermatology

## 2022-07-28 DIAGNOSIS — Z79899 Other long term (current) drug therapy: Secondary | ICD-10-CM | POA: Diagnosis not present

## 2022-07-28 DIAGNOSIS — L988 Other specified disorders of the skin and subcutaneous tissue: Secondary | ICD-10-CM | POA: Diagnosis not present

## 2022-07-28 DIAGNOSIS — L811 Chloasma: Secondary | ICD-10-CM | POA: Diagnosis not present

## 2022-07-28 LAB — HEPATITIS C ANTIBODY: Hepatitis C Ab: NONREACTIVE

## 2022-07-28 LAB — HIV ANTIBODY (ROUTINE TESTING W REFLEX): HIV 1&2 Ab, 4th Generation: NONREACTIVE

## 2022-07-28 NOTE — Progress Notes (Signed)
   Follow-Up Visit   Subjective  Melissa Baker is a 56 y.o. female who presents for the following: Follow-up (Melasma follow up - SM Hydroquinone/Kojic acid/Vitamin C cream) and Facial Elastosis (Would like to discuss Botox today).  The following portions of the chart were reviewed this encounter and updated as appropriate:   Tobacco  Allergies  Meds  Problems  Med Hx  Surg Hx  Fam Hx     Review of Systems:  No other skin or systemic complaints except as noted in HPI or Assessment and Plan.  Objective  Well appearing patient in no apparent distress; mood and affect are within normal limits.  A focused examination was performed including face. Relevant physical exam findings are noted in the Assessment and Plan.  Face Hyperpigmented patches         Face Rhytides and volume loss.    Assessment & Plan  Melasma Face Melasma is a chronic; persistent condition of hyperpigmented patches generally on the face, worse in summer due to higher UV exposure.    Heredity; thyroid disease; sun exposure; pregnancy; birth control pills; epilepsy medication and darker skin may predispose to Melasma.   Recommendations include: - Sun avoidance and daily broad spectrum (UVA/UVB) sunscreen SPF 30+, preferably with Zinc or Titanium Dioxide. - Rx topical bleaching creams (i.e. hydroquinone) is a common treatment but should not be used long term.  Hydroquinones may be mixed with retinoids; steroids; Kojic Acid. - Rx Azelaic Acid is also a treatment option that is safe for pregnancy (Category B). - OTC Heliocare can be helpful in control and prevention. - Oral Rx with Tranexamic Acid 250 mg - 650 mg po daily can be used for moderate to severe cases especially during summer (contraindications include pregnancy; lactation; hx of PE; DVT; clotting disorder; heart disease; anticoagulant use and upcoming long trips)   - Chemical peels (would need multiple for best result).  - Lasers and   Microdermabrasion may also be helpful adjunct treatments.  Start prescription Skin Medicinals Hydroquinone 8%, Tretinoin 0.1%, Kojic acid 1%, Niacinamide 4%, Fluocinolone 0.025% cream, a pea sized amount nightly to dark spots on face for up to 2 months. This cannot be used more than 3 months due to risk of skin atrophy (thinning) and exogenous ochronosis (permanent dark spots). The patient was advised this is not covered by insurance. They will receive an email to check out and the medication will be mailed to their home.   Elastosis of skin Face Discussed Botox. Advised patient she should start with 20 units to frown complex and the fee will be $260.00.  Return for Botox on a Tuesday, 6 month follow up.  I, Ashok Cordia, CMA, am acting as scribe for Sarina Ser, MD . Documentation: I have reviewed the above documentation for accuracy and completeness, and I agree with the above.  Sarina Ser, MD

## 2022-07-28 NOTE — Patient Instructions (Addendum)
Melasma is a chronic; persistent condition of hyperpigmented patches generally on the face, worse in summer due to higher UV exposure.    Heredity; thyroid disease; sun exposure; pregnancy; birth control pills; epilepsy medication and darker skin may predispose to Melasma.   Recommendations include: - Sun avoidance and daily broad spectrum (UVA/UVB) sunscreen SPF 30+, preferably with Zinc or Titanium Dioxide. - Rx topical bleaching creams (i.e. hydroquinone) is a common treatment but should not be used long term.  Hydroquinones may be mixed with retinoids; steroids; Kojic Acid. - Rx Azelaic Acid is also a treatment option that is safe for pregnancy (Category B). - OTC Heliocare can be helpful in control and prevention. - Oral Rx with Tranexamic Acid 250 mg - 650 mg po daily can be used for moderate to severe cases especially during summer (contraindications include pregnancy; lactation; hx of PE; DVT; clotting disorder; heart disease; anticoagulant use and upcoming long trips)   - Chemical peels (would need multiple for best result).  - Lasers and  Microdermabrasion may also be helpful adjunct treatments.   Discussed Botox. Advised patient she should start with 20 units to frown complex and the fee will be $260.00.    Due to recent changes in healthcare laws, you may see results of your pathology and/or laboratory studies on MyChart before the doctors have had a chance to review them. We understand that in some cases there may be results that are confusing or concerning to you. Please understand that not all results are received at the same time and often the doctors may need to interpret multiple results in order to provide you with the best plan of care or course of treatment. Therefore, we ask that you please give [place] 2 business days to thoroughly review all your results before contacting the office for clarification. Should we see a critical lab result, you will be contacted sooner.   If You  Need Anything After Your Visit  If you have any questions or concerns for your doctor, please call our main line at [phone] and press option 4 to reach your doctor's medical assistant. If no one answers, please leave a voicemail as directed and we will return your call as soon as possible. Messages left after 4 pm will be answered the following business day.   You may also send [place] a message via [place]. We typically respond to MyChart messages within 1-2 business days.  For prescription refills, please ask your pharmacy to contact our office. Our fax number is [phone].  If you have an urgent issue when the clinic is closed that cannot wait until the next business day, you can page your doctor at the number below.    Please note that while we do our best to be available for urgent issues outside of office hours, we are not available 24/7.   If you have an urgent issue and are unable to reach [place], you may choose to seek medical care at your doctor's office, retail clinic, urgent care center, or emergency room.  If you have a medical emergency, please immediately call 911 or go to the emergency department.  Pager Numbers  - Dr. Nehemiah Massed: [phone]  - Dr. Laurence Ferrari: [phone]  - Dr. Nicole Kindred: [phone]  In the event of inclement weather, please call our main line at [phone] for an update on the status of any delays or closures.  Dermatology Medication Tips: Please keep the boxes that topical medications come in in order to help keep track of  the instructions about where and how to use these. Pharmacies typically print the medication instructions only on the boxes and not directly on the medication tubes.   If your medication is too expensive, please contact our office at [phone] option 4 or send [place] a message through [place].   We are unable to tell what your co-pay for medications will be in advance as this is different depending on your insurance coverage. However,  we may be able to find a substitute medication at lower cost or fill out paperwork to get insurance to cover a needed medication.   If a prior authorization is required to get your medication covered by your insurance company, please allow [place] 1-2 business days to complete this process.  Drug prices often vary depending on where the prescription is filled and some pharmacies may offer cheaper prices.  The website www.goodrx.com contains coupons for medications through different pharmacies. The prices here do not account for what the cost may be with help from insurance (it may be cheaper with your insurance), but the website can give you the price if you did not use any insurance.  - You can print the associated coupon and take it with your prescription to the pharmacy.  - You may also stop by our office during regular business hours and pick up a GoodRx coupon card.  - If you need your prescription sent electronically to a different pharmacy, notify our office through [facility] or by phone at [phone] option 4.     Si Usted Necesita Algo Despus de Su Visita  Tambin puede enviarnos un mensaje a travs de Pharmacist, community. Por lo general respondemos a los mensajes de MyChart en el transcurso de 1 a 2 das hbiles.  Para renovar recetas, por favor pida a su farmacia que se ponga en contacto con nuestra oficina. Harland Dingwall de fax es [place] [phone].  Si tiene un asunto urgente cuando la clnica est cerrada y que no puede esperar hasta el siguiente da hbil, puede llamar/localizar a su doctor(a) al nmero que aparece a continuacin.   Por favor, tenga en cuenta que aunque hacemos todo lo posible para estar disponibles para asuntos urgentes fuera del horario de [place], no estamos disponibles las 24 horas del da, los 7 das de la [place].   Si tiene un problema urgente y no puede comunicarse con nosotros, puede optar por buscar atencin mdica  en el consultorio de su doctor(a), en una  clnica privada, en un centro de atencin urgente o en una sala de emergencias.  Si tiene Engineering geologist, por favor llame inmediatamente al 911 o vaya a la sala de emergencias.  Nmeros de bper  - Dr. Nehemiah Massed: [phone]  - Dra. Moye: [phone]  - Dra. Nicole Kindred: [phone]  En caso de inclemencias del [place], por favor llame a Johnsie Kindred principal al [phone] para una actualizacin sobre el [place] de cualquier retraso o cierre.  Consejos para la medicacin en dermatologa: Por favor, guarde las cajas en las que vienen los medicamentos de uso tpico para ayudarle a seguir las instrucciones sobre dnde y cmo usarlos. Las farmacias generalmente imprimen las instrucciones del medicamento slo en las cajas y no directamente en los tubos del [place].   Si su medicamento es muy caro, por favor, pngase en contacto con Zigmund Daniel llamando al [phone] y presione la opcin 4 o envenos un mensaje a travs de Pharmacist, community.   No podemos decirle cul ser su copago por los medicamentos por adelantado  ya que esto es diferente dependiendo de la cobertura de su seguro. Sin embargo, es posible que podamos encontrar un medicamento sustituto a Electrical engineer un formulario para que el seguro cubra el medicamento que se considera necesario.   Si se requiere una autorizacin previa para que su compaa de seguros [place] su medicamento, por favor permtanos de 1 a 2 das hbiles para completar este proceso.  Los precios de los medicamentos varan con frecuencia dependiendo del Environmental consultant de dnde se surte la receta y alguna farmacias pueden ofrecer precios ms baratos.  El sitio web www.goodrx.com tiene cupones para medicamentos de Airline pilot. Los precios aqu no tienen en cuenta lo que podra costar con la ayuda del seguro (puede ser ms barato con su seguro), pero el sitio web puede darle el precio si no utiliz Research scientist (physical sciences).  - Puede imprimir el cupn correspondiente y  llevarlo con su receta a la farmacia.  - Tambin puede pasar por nuestra oficina durante el horario de atencin regular y Charity fundraiser una tarjeta de cupones de GoodRx.  - Si necesita que su receta se enve electrnicamente a una farmacia diferente, informe a nuestra oficina a travs de MyChart de [place] o por telfono llamando al [phone] y presione la opcin 4.

## 2022-07-29 ENCOUNTER — Other Ambulatory Visit: Payer: Self-pay

## 2022-07-29 DIAGNOSIS — R7301 Impaired fasting glucose: Secondary | ICD-10-CM

## 2022-07-29 DIAGNOSIS — E785 Hyperlipidemia, unspecified: Secondary | ICD-10-CM

## 2022-07-29 LAB — CYTOLOGY - PAP
Comment: NEGATIVE
Diagnosis: NEGATIVE
High risk HPV: NEGATIVE

## 2022-07-29 NOTE — Telephone Encounter (Signed)
Orders pended. Please let me know if ok?

## 2022-07-29 NOTE — Addendum Note (Signed)
Addended by: Crecencio Mc on: 07/29/2022 04:18 PM   Modules accepted: Orders

## 2022-08-05 ENCOUNTER — Encounter: Payer: Self-pay | Admitting: Dermatology

## 2022-08-21 DIAGNOSIS — Z7960 Long term (current) use of unspecified immunomodulators and immunosuppressants: Secondary | ICD-10-CM | POA: Diagnosis not present

## 2022-08-21 DIAGNOSIS — M170 Bilateral primary osteoarthritis of knee: Secondary | ICD-10-CM | POA: Diagnosis not present

## 2022-08-21 DIAGNOSIS — M0579 Rheumatoid arthritis with rheumatoid factor of multiple sites without organ or systems involvement: Secondary | ICD-10-CM | POA: Diagnosis not present

## 2022-08-27 DIAGNOSIS — M25562 Pain in left knee: Secondary | ICD-10-CM | POA: Diagnosis not present

## 2022-08-27 DIAGNOSIS — M170 Bilateral primary osteoarthritis of knee: Secondary | ICD-10-CM | POA: Diagnosis not present

## 2022-08-27 DIAGNOSIS — R2689 Other abnormalities of gait and mobility: Secondary | ICD-10-CM | POA: Diagnosis not present

## 2022-08-27 DIAGNOSIS — M25561 Pain in right knee: Secondary | ICD-10-CM | POA: Diagnosis not present

## 2022-08-28 ENCOUNTER — Encounter: Payer: Self-pay | Admitting: Internal Medicine

## 2022-09-04 DIAGNOSIS — M170 Bilateral primary osteoarthritis of knee: Secondary | ICD-10-CM | POA: Diagnosis not present

## 2022-09-04 DIAGNOSIS — M25561 Pain in right knee: Secondary | ICD-10-CM | POA: Diagnosis not present

## 2022-09-04 DIAGNOSIS — R2689 Other abnormalities of gait and mobility: Secondary | ICD-10-CM | POA: Diagnosis not present

## 2022-09-04 DIAGNOSIS — M25562 Pain in left knee: Secondary | ICD-10-CM | POA: Diagnosis not present

## 2022-09-08 DIAGNOSIS — M25561 Pain in right knee: Secondary | ICD-10-CM | POA: Diagnosis not present

## 2022-09-08 DIAGNOSIS — R2689 Other abnormalities of gait and mobility: Secondary | ICD-10-CM | POA: Diagnosis not present

## 2022-09-08 DIAGNOSIS — M170 Bilateral primary osteoarthritis of knee: Secondary | ICD-10-CM | POA: Diagnosis not present

## 2022-09-08 DIAGNOSIS — M25562 Pain in left knee: Secondary | ICD-10-CM | POA: Diagnosis not present

## 2022-09-10 ENCOUNTER — Encounter: Payer: Self-pay | Admitting: Internal Medicine

## 2022-09-10 DIAGNOSIS — M25561 Pain in right knee: Secondary | ICD-10-CM | POA: Diagnosis not present

## 2022-09-10 DIAGNOSIS — M25562 Pain in left knee: Secondary | ICD-10-CM | POA: Diagnosis not present

## 2022-09-10 DIAGNOSIS — R2689 Other abnormalities of gait and mobility: Secondary | ICD-10-CM | POA: Diagnosis not present

## 2022-09-10 DIAGNOSIS — M170 Bilateral primary osteoarthritis of knee: Secondary | ICD-10-CM | POA: Diagnosis not present

## 2022-09-16 DIAGNOSIS — M25562 Pain in left knee: Secondary | ICD-10-CM | POA: Diagnosis not present

## 2022-09-16 DIAGNOSIS — M25561 Pain in right knee: Secondary | ICD-10-CM | POA: Diagnosis not present

## 2022-09-16 DIAGNOSIS — M170 Bilateral primary osteoarthritis of knee: Secondary | ICD-10-CM | POA: Diagnosis not present

## 2022-09-16 DIAGNOSIS — R2689 Other abnormalities of gait and mobility: Secondary | ICD-10-CM | POA: Diagnosis not present

## 2022-09-18 DIAGNOSIS — M25562 Pain in left knee: Secondary | ICD-10-CM | POA: Diagnosis not present

## 2022-09-18 DIAGNOSIS — M25561 Pain in right knee: Secondary | ICD-10-CM | POA: Diagnosis not present

## 2022-09-18 DIAGNOSIS — M170 Bilateral primary osteoarthritis of knee: Secondary | ICD-10-CM | POA: Diagnosis not present

## 2022-09-18 DIAGNOSIS — R2689 Other abnormalities of gait and mobility: Secondary | ICD-10-CM | POA: Diagnosis not present

## 2022-09-23 DIAGNOSIS — M170 Bilateral primary osteoarthritis of knee: Secondary | ICD-10-CM | POA: Diagnosis not present

## 2022-09-23 DIAGNOSIS — M25561 Pain in right knee: Secondary | ICD-10-CM | POA: Diagnosis not present

## 2022-09-23 DIAGNOSIS — R2689 Other abnormalities of gait and mobility: Secondary | ICD-10-CM | POA: Diagnosis not present

## 2022-09-23 DIAGNOSIS — M25562 Pain in left knee: Secondary | ICD-10-CM | POA: Diagnosis not present

## 2022-09-25 DIAGNOSIS — R2689 Other abnormalities of gait and mobility: Secondary | ICD-10-CM | POA: Diagnosis not present

## 2022-09-25 DIAGNOSIS — M25562 Pain in left knee: Secondary | ICD-10-CM | POA: Diagnosis not present

## 2022-09-25 DIAGNOSIS — M25561 Pain in right knee: Secondary | ICD-10-CM | POA: Diagnosis not present

## 2022-09-25 DIAGNOSIS — M170 Bilateral primary osteoarthritis of knee: Secondary | ICD-10-CM | POA: Diagnosis not present

## 2022-09-30 DIAGNOSIS — M170 Bilateral primary osteoarthritis of knee: Secondary | ICD-10-CM | POA: Diagnosis not present

## 2022-10-02 DIAGNOSIS — M25561 Pain in right knee: Secondary | ICD-10-CM | POA: Diagnosis not present

## 2022-10-02 DIAGNOSIS — M170 Bilateral primary osteoarthritis of knee: Secondary | ICD-10-CM | POA: Diagnosis not present

## 2022-10-02 DIAGNOSIS — R2689 Other abnormalities of gait and mobility: Secondary | ICD-10-CM | POA: Diagnosis not present

## 2022-10-02 DIAGNOSIS — M25562 Pain in left knee: Secondary | ICD-10-CM | POA: Diagnosis not present

## 2022-10-08 ENCOUNTER — Ambulatory Visit: Payer: 59 | Admitting: Dermatology

## 2022-10-08 VITALS — BP 141/73 | HR 73

## 2022-10-08 DIAGNOSIS — Z79899 Other long term (current) drug therapy: Secondary | ICD-10-CM | POA: Diagnosis not present

## 2022-10-08 DIAGNOSIS — B078 Other viral warts: Secondary | ICD-10-CM

## 2022-10-08 DIAGNOSIS — L700 Acne vulgaris: Secondary | ICD-10-CM

## 2022-10-08 DIAGNOSIS — L988 Other specified disorders of the skin and subcutaneous tissue: Secondary | ICD-10-CM | POA: Diagnosis not present

## 2022-10-08 DIAGNOSIS — D492 Neoplasm of unspecified behavior of bone, soft tissue, and skin: Secondary | ICD-10-CM

## 2022-10-08 NOTE — Progress Notes (Signed)
   Follow-Up Visit   Subjective  Melissa Baker is a 56 y.o. female who presents for the following: check spots (Face, 48m no symptoms) and Facial Elastosis (Face, pt interested in Botox). The patient has spots, moles and lesions to be evaluated, some may be new or changing and the patient has concerns that these could be cancer.  The following portions of the chart were reviewed this encounter and updated as appropriate:   Tobacco  Allergies  Meds  Problems  Med Hx  Surg Hx  Fam Hx     Review of Systems:  No other skin or systemic complaints except as noted in HPI or Assessment and Plan.  Objective  Well appearing patient in no apparent distress; mood and affect are within normal limits.  A focused examination was performed including face. Relevant physical exam findings are noted in the Assessment and Plan.  face Rhytides and volume loss.   R mandible Verrucous pap     L chin Pink pap  Assessment & Plan  Elastosis of skin face Discussed Botox 20 units to frown complex for $260  Discussed in detail with patient.  Her husband is having outpatient surgery and she is due to pick him up and would like to reschedule.  Neoplasm of skin R mandible 0.6 cm Epidermal / dermal shaving  Informed consent: discussed and consent obtained   Timeout: patient name, date of birth, surgical site, and procedure verified   Procedure prep:  Patient was prepped and draped in usual sterile fashion Prep type:  Isopropyl alcohol Anesthesia: the lesion was anesthetized in a standard fashion   Anesthetic:  1% lidocaine w/ epinephrine 1-100,000 buffered w/ 8.4% NaHCO3 Instrument used: flexible razor blade   Hemostasis achieved with: pressure, aluminum chloride and electrodesiccation   Outcome: patient tolerated procedure well   Post-procedure details: sterile dressing applied and wound care instructions given   Dressing type: bandage and petrolatum    Specimen 1 - Surgical  pathology Differential Diagnosis: D48.5 Wart vs ISK  Check Margins: yes Verrucous pap Wart vs ISK  Acne vulgaris L chin Start Aklief qhs to aa samples x 2, Lot [id number] 05/25 Start Avar cleanser apply after Aklief qhs samples x 2 lot [id number] 02/2004  Return for schedule for Botox.  I, SOthelia Pulling RMA, am acting as scribe for DSarina Ser MD . Documentation: I have reviewed the above documentation for accuracy and completeness, and I agree with the above.  DSarina Ser MD

## 2022-10-08 NOTE — Patient Instructions (Addendum)
Apply Aklief to pimple first at bedtime then apply  Apply Avar cleanser after the [facility]  Wound Care Instructions  Cleanse wound gently with soap and water once a day then pat dry with clean gauze. Apply a thin coat of Petrolatum (petroleum jelly, "Vaseline") over the wound (unless you have an allergy to this). We recommend that you use a new, sterile tube of Vaseline. Do not pick or remove scabs. Do not remove the yellow or white "healing tissue" from the base of the wound.  Cover the wound with fresh, clean, nonstick gauze and secure with paper tape. You may use Band-Aids in place of gauze and tape if the wound is small enough, but would recommend trimming much of the tape off as there is often too much. Sometimes Band-Aids can irritate the skin.  You should call the office for your biopsy report after 1 week if you have not already been contacted.  If you experience any problems, such as abnormal amounts of bleeding, swelling, significant bruising, significant pain, or evidence of infection, please call the office immediately.  FOR ADULT SURGERY PATIENTS: If you need something for pain relief you may take 1 extra strength Tylenol (acetaminophen) AND 2 Ibuprofen ('200mg'$  each) together every 4 hours as needed for pain. (do not take these if you are allergic to them or if you have a reason you should not take them.) Typically, you may only need pain medication for 1 to 3 days.     Due to recent changes in healthcare laws, you may see results of your pathology and/or laboratory studies on MyChart before the doctors have had a chance to review them. We understand that in some cases there may be results that are confusing or concerning to you. Please understand that not all results are received at the same time and often the doctors may need to interpret multiple results in order to provide you with the best plan of care or course of treatment. Therefore, we ask that you please give [place] 2 business days  to thoroughly review all your results before contacting the office for clarification. Should we see a critical lab result, you will be contacted sooner.   If You Need Anything After Your Visit  If you have any questions or concerns for your doctor, please call our main line at [phone] and press option 4 to reach your doctor's medical assistant. If no one answers, please leave a voicemail as directed and we will return your call as soon as possible. Messages left after 4 pm will be answered the following business day.   You may also send [place] a message via [place]. We typically respond to MyChart messages within 1-2 business days.  For prescription refills, please ask your pharmacy to contact our office. Our fax number is [phone].  If you have an urgent issue when the clinic is closed that cannot wait until the next business day, you can page your doctor at the number below.    Please note that while we do our best to be available for urgent issues outside of office hours, we are not available 24/7.   If you have an urgent issue and are unable to reach [place], you may choose to seek medical care at your doctor's office, retail clinic, urgent care center, or emergency room.  If you have a medical emergency, please immediately call 911 or go to the emergency department.  Pager Numbers  - Dr. Nehemiah Massed: [phone]  - Dr. Laurence Ferrari: [phone]  -  Dr. Nicole Kindred: [phone]  In the event of inclement weather, please call our main line at [phone] for an update on the status of any delays or closures.  Dermatology Medication Tips: Please keep the boxes that topical medications come in in order to help keep track of the instructions about where and how to use these. Pharmacies typically print the medication instructions only on the boxes and not directly on the medication tubes.   If your medication is too expensive, please contact our office at [phone] option 4 or send [place] a message  through [place].   We are unable to tell what your co-pay for medications will be in advance as this is different depending on your insurance coverage. However, we may be able to find a substitute medication at lower cost or fill out paperwork to get insurance to cover a needed medication.   If a prior authorization is required to get your medication covered by your insurance company, please allow [place] 1-2 business days to complete this process.  Drug prices often vary depending on where the prescription is filled and some pharmacies may offer cheaper prices.  The website www.goodrx.com contains coupons for medications through different pharmacies. The prices here do not account for what the cost may be with help from insurance (it may be cheaper with your insurance), but the website can give you the price if you did not use any insurance.  - You can print the associated coupon and take it with your prescription to the pharmacy.  - You may also stop by our office during regular business hours and pick up a GoodRx coupon card.  - If you need your prescription sent electronically to a different pharmacy, notify our office through [facility] or by phone at [phone] option 4.     Si Usted Necesita Algo Despus de Su Visita  Tambin puede enviarnos un mensaje a travs de Pharmacist, community. Por lo general respondemos a los mensajes de MyChart en el transcurso de 1 a 2 das hbiles.  Para renovar recetas, por favor pida a su farmacia que se ponga en contacto con nuestra oficina. Harland Dingwall de fax es [place] [phone].  Si tiene un asunto urgente cuando la clnica est cerrada y que no puede esperar hasta el siguiente da hbil, puede llamar/localizar a su doctor(a) al nmero que aparece a continuacin.   Por favor, tenga en cuenta que aunque hacemos todo lo posible para estar disponibles para asuntos urgentes fuera del horario de [place], no estamos disponibles las 24 horas del da, los 7 das de  la [place].   Si tiene un problema urgente y no puede comunicarse con nosotros, puede optar por buscar atencin mdica  en el consultorio de su doctor(a), en una clnica privada, en un centro de atencin urgente o en una sala de emergencias.  Si tiene Engineering geologist, por favor llame inmediatamente al 911 o vaya a la sala de emergencias.  Nmeros de bper  - Dr. Nehemiah Massed: [phone]  - Dra. Moye: [phone]  - Dra. Nicole Kindred: [phone]  En caso de inclemencias del [place], por favor llame a Johnsie Kindred principal al [phone] para una actualizacin sobre el [place] de cualquier retraso o cierre.  Consejos para la medicacin en dermatologa: Por favor, guarde las cajas en las que vienen los medicamentos de uso tpico para ayudarle a seguir las instrucciones sobre dnde y cmo usarlos. Las farmacias generalmente imprimen las instrucciones del medicamento slo en las cajas y no directamente en los tubos  del medicamento.   Si su medicamento es muy caro, por favor, pngase en contacto con Zigmund Daniel llamando al [phone] y presione la opcin 4 o envenos un mensaje a travs de Pharmacist, community.   No podemos decirle cul ser su copago por los medicamentos por adelantado ya que esto es diferente dependiendo de la cobertura de su seguro. Sin embargo, es posible que podamos encontrar un medicamento sustituto a Electrical engineer un formulario para que el seguro cubra el medicamento que se considera necesario.   Si se requiere una autorizacin previa para que su compaa de seguros [place] su medicamento, por favor permtanos de 1 a 2 das hbiles para completar este proceso.  Los precios de los medicamentos varan con frecuencia dependiendo del Environmental consultant de dnde se surte la receta y alguna farmacias pueden ofrecer precios ms baratos.  El sitio web www.goodrx.com tiene cupones para medicamentos de Airline pilot. Los precios aqu no tienen en cuenta lo que podra costar con la ayuda  del seguro (puede ser ms barato con su seguro), pero el sitio web puede darle el precio si no utiliz Research scientist (physical sciences).  - Puede imprimir el cupn correspondiente y llevarlo con su receta a la farmacia.  - Tambin puede pasar por nuestra oficina durante el horario de atencin regular y Charity fundraiser una tarjeta de cupones de GoodRx.  - Si necesita que su receta se enve electrnicamente a una farmacia diferente, informe a nuestra oficina a travs de MyChart de [place] o por telfono llamando al [phone] y presione la opcin 4.

## 2022-10-09 DIAGNOSIS — R2689 Other abnormalities of gait and mobility: Secondary | ICD-10-CM | POA: Diagnosis not present

## 2022-10-09 DIAGNOSIS — M25562 Pain in left knee: Secondary | ICD-10-CM | POA: Diagnosis not present

## 2022-10-09 DIAGNOSIS — M170 Bilateral primary osteoarthritis of knee: Secondary | ICD-10-CM | POA: Diagnosis not present

## 2022-10-09 DIAGNOSIS — M25561 Pain in right knee: Secondary | ICD-10-CM | POA: Diagnosis not present

## 2022-10-14 DIAGNOSIS — M25562 Pain in left knee: Secondary | ICD-10-CM | POA: Diagnosis not present

## 2022-10-14 DIAGNOSIS — R2689 Other abnormalities of gait and mobility: Secondary | ICD-10-CM | POA: Diagnosis not present

## 2022-10-14 DIAGNOSIS — M25561 Pain in right knee: Secondary | ICD-10-CM | POA: Diagnosis not present

## 2022-10-14 DIAGNOSIS — M170 Bilateral primary osteoarthritis of knee: Secondary | ICD-10-CM | POA: Diagnosis not present

## 2022-10-18 ENCOUNTER — Encounter: Payer: Self-pay | Admitting: Dermatology

## 2022-10-21 ENCOUNTER — Telehealth: Payer: Self-pay

## 2022-10-21 DIAGNOSIS — M25562 Pain in left knee: Secondary | ICD-10-CM | POA: Diagnosis not present

## 2022-10-21 DIAGNOSIS — M25561 Pain in right knee: Secondary | ICD-10-CM | POA: Diagnosis not present

## 2022-10-21 DIAGNOSIS — R2689 Other abnormalities of gait and mobility: Secondary | ICD-10-CM | POA: Diagnosis not present

## 2022-10-21 DIAGNOSIS — M170 Bilateral primary osteoarthritis of knee: Secondary | ICD-10-CM | POA: Diagnosis not present

## 2022-10-21 NOTE — Telephone Encounter (Signed)
[facility] pathology and had location changed to R mandible./sh

## 2022-10-21 NOTE — Telephone Encounter (Signed)
-----   Message from Ralene Bathe, MD sent at 10/18/2022  2:22 PM EST ----- Please change path report to read Right Mandible  thanks

## 2022-10-21 NOTE — Telephone Encounter (Signed)
-----   Message from Ralene Bathe, MD sent at 10/17/2022  6:55 PM EST ----- Diagnosis Skin , right zygoma VERRUCA VULGARIS, INFLAMED, MARGIN CLOSE  Benign viral wart May recur No further treatment at this time unless recurs

## 2022-10-21 NOTE — Telephone Encounter (Signed)
Patient advised pathology showed benign viral wart. Lurlean Horns., RMA

## 2022-10-23 ENCOUNTER — Telehealth: Payer: Self-pay

## 2022-10-23 DIAGNOSIS — R2689 Other abnormalities of gait and mobility: Secondary | ICD-10-CM | POA: Diagnosis not present

## 2022-10-23 DIAGNOSIS — M170 Bilateral primary osteoarthritis of knee: Secondary | ICD-10-CM | POA: Diagnosis not present

## 2022-10-23 DIAGNOSIS — M25561 Pain in right knee: Secondary | ICD-10-CM | POA: Diagnosis not present

## 2022-10-23 DIAGNOSIS — M25562 Pain in left knee: Secondary | ICD-10-CM | POA: Diagnosis not present

## 2022-10-23 NOTE — Telephone Encounter (Signed)
Advised pt of bx result/sh ?

## 2022-10-23 NOTE — Telephone Encounter (Signed)
-----   Message from Ralene Bathe, MD sent at 10/22/2022  5:35 PM EST ----- Diagnosis Skin , right mandible VERRUCA VULGARIS, INFLAMED, MARGIN CLOSE  Change of location in report to Right Mandible (from Right Zygoma) Benign viral wart No further treatment needed unless recurs.

## 2022-10-29 DIAGNOSIS — M25561 Pain in right knee: Secondary | ICD-10-CM | POA: Diagnosis not present

## 2022-10-29 DIAGNOSIS — M170 Bilateral primary osteoarthritis of knee: Secondary | ICD-10-CM | POA: Diagnosis not present

## 2022-10-29 DIAGNOSIS — R2689 Other abnormalities of gait and mobility: Secondary | ICD-10-CM | POA: Diagnosis not present

## 2022-10-29 DIAGNOSIS — M25562 Pain in left knee: Secondary | ICD-10-CM | POA: Diagnosis not present

## 2022-10-30 DIAGNOSIS — M170 Bilateral primary osteoarthritis of knee: Secondary | ICD-10-CM | POA: Diagnosis not present

## 2022-10-30 DIAGNOSIS — M25561 Pain in right knee: Secondary | ICD-10-CM | POA: Diagnosis not present

## 2022-10-30 DIAGNOSIS — M25562 Pain in left knee: Secondary | ICD-10-CM | POA: Diagnosis not present

## 2022-10-30 DIAGNOSIS — R2689 Other abnormalities of gait and mobility: Secondary | ICD-10-CM | POA: Diagnosis not present

## 2022-11-06 DIAGNOSIS — M170 Bilateral primary osteoarthritis of knee: Secondary | ICD-10-CM | POA: Diagnosis not present

## 2022-11-06 DIAGNOSIS — M25561 Pain in right knee: Secondary | ICD-10-CM | POA: Diagnosis not present

## 2022-11-06 DIAGNOSIS — R2689 Other abnormalities of gait and mobility: Secondary | ICD-10-CM | POA: Diagnosis not present

## 2022-11-06 DIAGNOSIS — M25562 Pain in left knee: Secondary | ICD-10-CM | POA: Diagnosis not present

## 2022-11-07 DIAGNOSIS — M170 Bilateral primary osteoarthritis of knee: Secondary | ICD-10-CM | POA: Diagnosis not present

## 2022-11-07 DIAGNOSIS — M25561 Pain in right knee: Secondary | ICD-10-CM | POA: Diagnosis not present

## 2022-11-07 DIAGNOSIS — R2689 Other abnormalities of gait and mobility: Secondary | ICD-10-CM | POA: Diagnosis not present

## 2022-11-07 DIAGNOSIS — M25562 Pain in left knee: Secondary | ICD-10-CM | POA: Diagnosis not present

## 2022-11-10 DIAGNOSIS — R2689 Other abnormalities of gait and mobility: Secondary | ICD-10-CM | POA: Diagnosis not present

## 2022-11-10 DIAGNOSIS — M25562 Pain in left knee: Secondary | ICD-10-CM | POA: Diagnosis not present

## 2022-11-10 DIAGNOSIS — M170 Bilateral primary osteoarthritis of knee: Secondary | ICD-10-CM | POA: Diagnosis not present

## 2022-11-10 DIAGNOSIS — M25561 Pain in right knee: Secondary | ICD-10-CM | POA: Diagnosis not present

## 2022-11-13 DIAGNOSIS — M170 Bilateral primary osteoarthritis of knee: Secondary | ICD-10-CM | POA: Diagnosis not present

## 2022-11-13 DIAGNOSIS — M25561 Pain in right knee: Secondary | ICD-10-CM | POA: Diagnosis not present

## 2022-11-13 DIAGNOSIS — R2689 Other abnormalities of gait and mobility: Secondary | ICD-10-CM | POA: Diagnosis not present

## 2022-11-13 DIAGNOSIS — M25562 Pain in left knee: Secondary | ICD-10-CM | POA: Diagnosis not present

## 2022-11-14 ENCOUNTER — Ambulatory Visit
Admission: RE | Admit: 2022-11-14 | Discharge: 2022-11-14 | Disposition: A | Discharge status: Home / Self Care | Payer: 59 | Source: Ambulatory Visit | Attending: Internal Medicine | Admitting: Internal Medicine

## 2022-11-14 DIAGNOSIS — Z1231 Encounter for screening mammogram for malignant neoplasm of breast: Secondary | ICD-10-CM | POA: Diagnosis not present

## 2022-11-17 DIAGNOSIS — M25562 Pain in left knee: Secondary | ICD-10-CM | POA: Diagnosis not present

## 2022-11-17 DIAGNOSIS — M170 Bilateral primary osteoarthritis of knee: Secondary | ICD-10-CM | POA: Diagnosis not present

## 2022-11-17 DIAGNOSIS — M25561 Pain in right knee: Secondary | ICD-10-CM | POA: Diagnosis not present

## 2022-11-17 DIAGNOSIS — R2689 Other abnormalities of gait and mobility: Secondary | ICD-10-CM | POA: Diagnosis not present

## 2022-11-19 ENCOUNTER — Inpatient Hospital Stay
Admission: RE | Admit: 2022-11-19 | Discharge: 2022-11-19 | Disposition: A | Discharge status: Home / Self Care | Payer: Self-pay | Source: Ambulatory Visit | Attending: *Deleted | Admitting: *Deleted

## 2022-11-19 ENCOUNTER — Other Ambulatory Visit: Payer: Self-pay | Admitting: *Deleted

## 2022-11-19 DIAGNOSIS — Z1231 Encounter for screening mammogram for malignant neoplasm of breast: Secondary | ICD-10-CM

## 2022-11-20 ENCOUNTER — Ambulatory Visit (INDEPENDENT_AMBULATORY_CARE_PROVIDER_SITE_OTHER): Payer: Self-pay | Admitting: Dermatology

## 2022-11-20 VITALS — BP 122/85 | HR 84

## 2022-11-20 DIAGNOSIS — L988 Other specified disorders of the skin and subcutaneous tissue: Secondary | ICD-10-CM

## 2022-11-20 MED ORDER — TRETINOIN 0.025 % EX CREA: TOPICAL_CREAM | Freq: Every day | CUTANEOUS | 11 refills | Status: DC

## 2022-11-20 NOTE — Patient Instructions (Addendum)
Start tretinoin 0.025 % cream apply pea sized amount to face nightly as tolerated  Can apply moisturizer before or after applying tretinoin to help with dryness  Topical retinoid medications like tretinoin/Retin-A, adapalene/Differin, tazarotene/Fabior, and Epiduo/Epiduo Forte can cause dryness and irritation when first started. Only apply a pea-sized amount to the entire affected area. Avoid applying it around the eyes, edges of mouth and creases at the nose. If you experience irritation, use a good moisturizer first and/or apply the medicine less often. If you are doing well with the medicine, you can increase how often you use it until you are applying every night. Be careful with sun protection while using this medication as it can make you sensitive to the sun. This medicine should not be used by pregnant women.       Due to recent changes in healthcare laws, you may see results of your pathology and/or laboratory studies on MyChart before the doctors have had a chance to review them. We understand that in some cases there may be results that are confusing or concerning to you. Please understand that not all results are received at the same time and often the doctors may need to interpret multiple results in order to provide you with the best plan of care or course of treatment. Therefore, we ask that you please give [place] 2 business days to thoroughly review all your results before contacting the office for clarification. Should we see a critical lab result, you will be contacted sooner.   If You Need Anything After Your Visit  If you have any questions or concerns for your doctor, please call our main line at [phone] and press option 4 to reach your doctor's medical assistant. If no one answers, please leave a voicemail as directed and we will return your call as soon as possible. Messages left after 4 pm will be answered the following business day.   You may also send [place] a message via  [place]. We typically respond to MyChart messages within 1-2 business days.  For prescription refills, please ask your pharmacy to contact our office. Our fax number is [phone].  If you have an urgent issue when the clinic is closed that cannot wait until the next business day, you can page your doctor at the number below.    Please note that while we do our best to be available for urgent issues outside of office hours, we are not available 24/7.   If you have an urgent issue and are unable to reach [place], you may choose to seek medical care at your doctor's office, retail clinic, urgent care center, or emergency room.  If you have a medical emergency, please immediately call 911 or go to the emergency department.  Pager Numbers  - Dr. Nehemiah Massed: [phone]  - Dr. Laurence Ferrari: [phone]  - Dr. Nicole Kindred: [phone]  In the event of inclement weather, please call our main line at [phone] for an update on the status of any delays or closures.  Dermatology Medication Tips: Please keep the boxes that topical medications come in in order to help keep track of the instructions about where and how to use these. Pharmacies typically print the medication instructions only on the boxes and not directly on the medication tubes.   If your medication is too expensive, please contact our office at [phone] option 4 or send [place] a message through [place].   We are unable to tell what your co-pay for medications will be in advance  as this is different depending on your insurance coverage. However, we may be able to find a substitute medication at lower cost or fill out paperwork to get insurance to cover a needed medication.   If a prior authorization is required to get your medication covered by your insurance company, please allow [place] 1-2 business days to complete this process.  Drug prices often vary depending on where the prescription is filled and some pharmacies may offer cheaper  prices.  The website www.goodrx.com contains coupons for medications through different pharmacies. The prices here do not account for what the cost may be with help from insurance (it may be cheaper with your insurance), but the website can give you the price if you did not use any insurance.  - You can print the associated coupon and take it with your prescription to the pharmacy.  - You may also stop by our office during regular business hours and pick up a GoodRx coupon card.  - If you need your prescription sent electronically to a different pharmacy, notify our office through [facility] or by phone at [phone] option 4.     Si Usted Necesita Algo Despus de Su Visita  Tambin puede enviarnos un mensaje a travs de Pharmacist, community. Por lo general respondemos a los mensajes de MyChart en el transcurso de 1 a 2 das hbiles.  Para renovar recetas, por favor pida a su farmacia que se ponga en contacto con nuestra oficina. Harland Dingwall de fax es [place] [phone].  Si tiene un asunto urgente cuando la clnica est cerrada y que no puede esperar hasta el siguiente da hbil, puede llamar/localizar a su doctor(a) al nmero que aparece a continuacin.   Por favor, tenga en cuenta que aunque hacemos todo lo posible para estar disponibles para asuntos urgentes fuera del horario de [place], no estamos disponibles las 24 horas del da, los 7 das de la [place].   Si tiene un problema urgente y no puede comunicarse con nosotros, puede optar por buscar atencin mdica  en el consultorio de su doctor(a), en una clnica privada, en un centro de atencin urgente o en una sala de emergencias.  Si tiene Engineering geologist, por favor llame inmediatamente al 911 o vaya a la sala de emergencias.  Nmeros de bper  - Dr. Nehemiah Massed: [phone]  - Dra. Moye: [phone]  - Dra. Nicole Kindred: [phone]  En caso de inclemencias del [place], por favor llame a Johnsie Kindred principal al [phone]  para una actualizacin sobre el [place] de cualquier retraso o cierre.  Consejos para la medicacin en dermatologa: Por favor, guarde las cajas en las que vienen los medicamentos de uso tpico para ayudarle a seguir las instrucciones sobre dnde y cmo usarlos. Las farmacias generalmente imprimen las instrucciones del medicamento slo en las cajas y no directamente en los tubos del [place].   Si su medicamento es muy caro, por favor, pngase en contacto con Zigmund Daniel llamando al [phone] y presione la opcin 4 o envenos un mensaje a travs de Pharmacist, community.   No podemos decirle cul ser su copago por los medicamentos por adelantado ya que esto es diferente dependiendo de la cobertura de su seguro. Sin embargo, es posible que podamos encontrar un medicamento sustituto a Electrical engineer un formulario para que el seguro cubra el medicamento que se considera necesario.   Si se requiere una autorizacin previa para que su compaa de seguros [place] su medicamento, por favor permtanos de 1 a 2 das  hbiles para completar este proceso.  Los precios de los medicamentos varan con frecuencia dependiendo del Environmental consultant de dnde se surte la receta y alguna farmacias pueden ofrecer precios ms baratos.  El sitio web www.goodrx.com tiene cupones para medicamentos de Airline pilot. Los precios aqu no tienen en cuenta lo que podra costar con la ayuda del seguro (puede ser ms barato con su seguro), pero el sitio web puede darle el precio si no utiliz Research scientist (physical sciences).  - Puede imprimir el cupn correspondiente y llevarlo con su receta a la farmacia.  - Tambin puede pasar por nuestra oficina durante el horario de atencin regular y Charity fundraiser una tarjeta de cupones de GoodRx.  - Si necesita que su receta se enve electrnicamente a una farmacia diferente, informe a nuestra oficina a travs de MyChart de [place] o por telfono llamando al [phone] y presione la opcin 4.

## 2022-11-20 NOTE — Progress Notes (Signed)
   Follow-Up Visit   Subjective  Melissa Baker is a 57 y.o. female who presents for the following: Facial Elastosis (Patient here today for botox. She would also like to discuss a cream to use at face to help with wrinkles and texture. ).  The following portions of the chart were reviewed this encounter and updated as appropriate:  Tobacco  Allergies  Meds  Problems  Med Hx  Surg Hx  Fam Hx     Review of Systems: No other skin or systemic complaints except as noted in HPI or Assessment and Plan.  Objective  Well appearing patient in no apparent distress; mood and affect are within normal limits.  A focused examination was performed including face. Relevant physical exam findings are noted in the Assessment and Plan.  face Rhytides and volume loss.             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   Topical retinoid medications like tretinoin/Retin-A, adapalene/Differin, tazarotene/Fabior, and Epiduo/Epiduo Forte can cause dryness and irritation when first started. Only apply a pea-sized amount to the entire affected area. Avoid applying it around the eyes, edges of mouth and creases at the nose. If you experience irritation, use a good moisturizer first and/or apply the medicine less often. If you are doing well with the medicine, you can increase how often you use it until you are applying every night. Be careful with sun protection while using this medication as it can make you sensitive to the sun. This medicine should not be used by pregnant women.   Botox Injection - face Location: See attached image  Informed consent: Discussed risks (infection, pain, bleeding, bruising, swelling, allergic reaction, paralysis of nearby muscles, eyelid droop, double vision, neck weakness, difficulty breathing, headache, undesirable cosmetic result,  and need for additional treatment) and benefits of the procedure, as well as the alternatives.  Informed consent was obtained.  Preparation: The area was cleansed with alcohol.  Procedure Details:  Botox was injected into the dermis with a 30-gauge needle. Pressure applied to any bleeding. Ice packs offered for swelling.  Lot Number:  [id number] Expiration:  12/2024  Total Units Injected:  20  Plan: PTylenol may be used for headache.  Allow 2 weeks before returning to clinic for additional dosing as needed. Patient will call for any problems.  tretinoin (RETIN-A) 0.025 % cream - face Apply topically at bedtime. Apply pea sized amount to face nightly as tolerated. Can use follow with moisturizer  Return for 4 week botox follow up.  IRuthell Rummage, CMA, am acting as scribe for Sarina Ser, MD. Documentation: I have reviewed the above documentation for accuracy and completeness, and I agree with the above.  Sarina Ser, MD

## 2022-11-21 DIAGNOSIS — M170 Bilateral primary osteoarthritis of knee: Secondary | ICD-10-CM | POA: Diagnosis not present

## 2022-11-21 DIAGNOSIS — R2689 Other abnormalities of gait and mobility: Secondary | ICD-10-CM | POA: Diagnosis not present

## 2022-11-21 DIAGNOSIS — M25562 Pain in left knee: Secondary | ICD-10-CM | POA: Diagnosis not present

## 2022-11-21 DIAGNOSIS — M25561 Pain in right knee: Secondary | ICD-10-CM | POA: Diagnosis not present

## 2022-11-24 ENCOUNTER — Encounter: Payer: Self-pay | Admitting: Dermatology

## 2022-11-24 ENCOUNTER — Other Ambulatory Visit: Payer: Self-pay

## 2022-11-24 DIAGNOSIS — L988 Other specified disorders of the skin and subcutaneous tissue: Secondary | ICD-10-CM

## 2022-11-24 MED ORDER — TRETINOIN 0.025 % EX CREA: TOPICAL_CREAM | Freq: Every day | CUTANEOUS | 11 refills | Status: DC

## 2022-11-24 NOTE — Progress Notes (Signed)
Tretinoin sent to [facility]./sh

## 2022-11-25 ENCOUNTER — Encounter: Payer: Self-pay | Admitting: Dermatology

## 2022-11-26 DIAGNOSIS — M25561 Pain in right knee: Secondary | ICD-10-CM | POA: Diagnosis not present

## 2022-11-26 DIAGNOSIS — M170 Bilateral primary osteoarthritis of knee: Secondary | ICD-10-CM | POA: Diagnosis not present

## 2022-11-26 DIAGNOSIS — M25562 Pain in left knee: Secondary | ICD-10-CM | POA: Diagnosis not present

## 2022-11-26 DIAGNOSIS — R2689 Other abnormalities of gait and mobility: Secondary | ICD-10-CM | POA: Diagnosis not present

## 2022-12-02 DIAGNOSIS — M25562 Pain in left knee: Secondary | ICD-10-CM | POA: Diagnosis not present

## 2022-12-02 DIAGNOSIS — R2689 Other abnormalities of gait and mobility: Secondary | ICD-10-CM | POA: Diagnosis not present

## 2022-12-02 DIAGNOSIS — M25561 Pain in right knee: Secondary | ICD-10-CM | POA: Diagnosis not present

## 2022-12-02 DIAGNOSIS — M170 Bilateral primary osteoarthritis of knee: Secondary | ICD-10-CM | POA: Diagnosis not present

## 2022-12-09 ENCOUNTER — Ambulatory Visit (INDEPENDENT_AMBULATORY_CARE_PROVIDER_SITE_OTHER): Payer: Self-pay | Admitting: Dermatology

## 2022-12-09 VITALS — BP 144/80

## 2022-12-09 DIAGNOSIS — L988 Other specified disorders of the skin and subcutaneous tissue: Secondary | ICD-10-CM

## 2022-12-09 NOTE — Progress Notes (Signed)
   Follow-Up Visit   Subjective  Melissa Baker is a 57 y.o. female who presents for the following: Follow-up (3 week Botox follow up).  The following portions of the chart were reviewed this encounter and updated as appropriate:   Tobacco  Allergies  Meds  Problems  Med Hx  Surg Hx  Fam Hx     Review of Systems:  No other skin or systemic complaints except as noted in HPI or Assessment and Plan.  Objective  Well appearing patient in no apparent distress; mood and affect are within normal limits.  A focused examination was performed including face. Relevant physical exam findings are noted in the Assessment and Plan.  Face          Assessment & Plan  Elastosis of skin Face Good improvement with Botox injections -very slight right glabella and left glabella wrinkle when she tries to frowned but much improved over pretreatment.  Discussed we can increase her dosing in the future if she desires at the next session or beyond. We do not need to increase if she is happy with this.  Currently she is happy and we will discuss this further at next visit.  Related Medications tretinoin (RETIN-A) 0.025 % cream Apply topically at bedtime. Apply pea sized amount to face nightly as tolerated. Can use follow with moisturizer  Return in about 14 weeks (around 03/17/2023) for Botox in 3-4 months.  Documentation: I have reviewed the above documentation for accuracy and completeness, and I agree with the above.  Sarina Ser, MD

## 2022-12-09 NOTE — Patient Instructions (Signed)
Due to recent changes in healthcare laws, you may see results of your pathology and/or laboratory studies on MyChart before the doctors have had a chance to review them. We understand that in some cases there may be results that are confusing or concerning to you. Please understand that not all results are received at the same time and often the doctors may need to interpret multiple results in order to provide you with the best plan of care or course of treatment. Therefore, we ask that you please give us 2 business days to thoroughly review all your results before contacting the office for clarification. Should we see a critical lab result, you will be contacted sooner.   If You Need Anything After Your Visit  If you have any questions or concerns for your doctor, please call our main line at [phone] and press option 4 to reach your doctor's medical assistant. If no one answers, please leave a voicemail as directed and we will return your call as soon as possible. Messages left after 4 pm will be answered the following business day.   You may also send us a message via MyChart. We typically respond to MyChart messages within 1-2 business days.  For prescription refills, please ask your pharmacy to contact our office. Our fax number is [phone].  If you have an urgent issue when the clinic is closed that cannot wait until the next business day, you can page your doctor at the number below.    Please note that while we do our best to be available for urgent issues outside of office hours, we are not available 24/7.   If you have an urgent issue and are unable to reach us, you may choose to seek medical care at your doctor's office, retail clinic, urgent care center, or emergency room.  If you have a medical emergency, please immediately call 911 or go to the emergency department.  Pager Numbers  - Dr. Kowalski: [phone]  - Dr. Moye: [phone]  - Dr. Stewart:  [phone]  In the event of inclement weather, please call our main line at [phone] for an update on the status of any delays or closures.  Dermatology Medication Tips: Please keep the boxes that topical medications come in in order to help keep track of the instructions about where and how to use these. Pharmacies typically print the medication instructions only on the boxes and not directly on the medication tubes.   If your medication is too expensive, please contact our office at [phone] option 4 or send us a message through MyChart.   We are unable to tell what your co-pay for medications will be in advance as this is different depending on your insurance coverage. However, we may be able to find a substitute medication at lower cost or fill out paperwork to get insurance to cover a needed medication.   If a prior authorization is required to get your medication covered by your insurance company, please allow us 1-2 business days to complete this process.  Drug prices often vary depending on where the prescription is filled and some pharmacies may offer cheaper prices.  The website www.goodrx.com contains coupons for medications through different pharmacies. The prices here do not account for what the cost may be with help from insurance (it may be cheaper with your insurance), but the website can give you the price if you did not use any insurance.  - You can print the associated coupon and take it with   your prescription to the pharmacy.  - You may also stop by our office during regular business hours and pick up a GoodRx coupon card.  - If you need your prescription sent electronically to a different pharmacy, notify our office through [facility] MyChart or by phone at [phone] option 4.     Si Usted Necesita Algo Despus de Su Visita  Tambin puede enviarnos un mensaje a travs de MyChart. Por lo general respondemos a los mensajes de MyChart en el transcurso de 1 a 2  das hbiles.  Para renovar recetas, por favor pida a su farmacia que se ponga en contacto con nuestra oficina. Nuestro nmero de fax es el [phone].  Si tiene un asunto urgente cuando la clnica est cerrada y que no puede esperar hasta el siguiente da hbil, puede llamar/localizar a su doctor(a) al nmero que aparece a continuacin.   Por favor, tenga en cuenta que aunque hacemos todo lo posible para estar disponibles para asuntos urgentes fuera del horario de oficina, no estamos disponibles las 24 horas del da, los 7 das de la semana.   Si tiene un problema urgente y no puede comunicarse con nosotros, puede optar por buscar atencin mdica  en el consultorio de su doctor(a), en una clnica privada, en un centro de atencin urgente o en una sala de emergencias.  Si tiene una emergencia mdica, por favor llame inmediatamente al 911 o vaya a la sala de emergencias.  Nmeros de bper  - Dr. Kowalski: [phone]  - Dra. Moye: [phone]  - Dra. Stewart: [phone]  En caso de inclemencias del tiempo, por favor llame a nuestra lnea principal al [phone] para una actualizacin sobre el estado de cualquier retraso o cierre.  Consejos para la medicacin en dermatologa: Por favor, guarde las cajas en las que vienen los medicamentos de uso tpico para ayudarle a seguir las instrucciones sobre dnde y cmo usarlos. Las farmacias generalmente imprimen las instrucciones del medicamento slo en las cajas y no directamente en los tubos del medicamento.   Si su medicamento es muy caro, por favor, pngase en contacto con nuestra oficina llamando al [phone] y presione la opcin 4 o envenos un mensaje a travs de MyChart.   No podemos decirle cul ser su copago por los medicamentos por adelantado ya que esto es diferente dependiendo de la cobertura de su seguro. Sin embargo, es posible que podamos encontrar un medicamento sustituto a menor costo o llenar un formulario para que el  seguro cubra el medicamento que se considera necesario.   Si se requiere una autorizacin previa para que su compaa de seguros cubra su medicamento, por favor permtanos de 1 a 2 das hbiles para completar este proceso.  Los precios de los medicamentos varan con frecuencia dependiendo del lugar de dnde se surte la receta y alguna farmacias pueden ofrecer precios ms baratos.  El sitio web www.goodrx.com tiene cupones para medicamentos de diferentes farmacias. Los precios aqu no tienen en cuenta lo que podra costar con la ayuda del seguro (puede ser ms barato con su seguro), pero el sitio web puede darle el precio si no utiliz ningn seguro.  - Puede imprimir el cupn correspondiente y llevarlo con su receta a la farmacia.  - Tambin puede pasar por nuestra oficina durante el horario de atencin regular y recoger una tarjeta de cupones de GoodRx.  - Si necesita que su receta se enve electrnicamente a una farmacia diferente, informe a nuestra oficina a travs de MyChart de    o por telfono llamando al [phone] y presione la opcin 4.  

## 2022-12-11 ENCOUNTER — Ambulatory Visit: Payer: 59 | Admitting: Dermatology

## 2022-12-11 DIAGNOSIS — M25561 Pain in right knee: Secondary | ICD-10-CM | POA: Diagnosis not present

## 2022-12-11 DIAGNOSIS — M170 Bilateral primary osteoarthritis of knee: Secondary | ICD-10-CM | POA: Diagnosis not present

## 2022-12-11 DIAGNOSIS — M25562 Pain in left knee: Secondary | ICD-10-CM | POA: Diagnosis not present

## 2022-12-11 DIAGNOSIS — R2689 Other abnormalities of gait and mobility: Secondary | ICD-10-CM | POA: Diagnosis not present

## 2022-12-12 ENCOUNTER — Other Ambulatory Visit: Payer: Self-pay | Admitting: Internal Medicine

## 2022-12-12 DIAGNOSIS — E039 Hypothyroidism, unspecified: Secondary | ICD-10-CM

## 2022-12-12 MED ORDER — LEVOTHYROXINE SODIUM 100 MCG PO TABS: ORAL_TABLET | ORAL | 0 refills | Status: DC

## 2022-12-14 ENCOUNTER — Encounter: Payer: Self-pay | Admitting: Dermatology

## 2022-12-17 DIAGNOSIS — M25561 Pain in right knee: Secondary | ICD-10-CM | POA: Diagnosis not present

## 2022-12-17 DIAGNOSIS — M170 Bilateral primary osteoarthritis of knee: Secondary | ICD-10-CM | POA: Diagnosis not present

## 2022-12-17 DIAGNOSIS — R2689 Other abnormalities of gait and mobility: Secondary | ICD-10-CM | POA: Diagnosis not present

## 2022-12-17 DIAGNOSIS — M25562 Pain in left knee: Secondary | ICD-10-CM | POA: Diagnosis not present

## 2022-12-26 DIAGNOSIS — M25562 Pain in left knee: Secondary | ICD-10-CM | POA: Diagnosis not present

## 2022-12-26 DIAGNOSIS — Z7960 Long term (current) use of unspecified immunomodulators and immunosuppressants: Secondary | ICD-10-CM | POA: Diagnosis not present

## 2022-12-26 DIAGNOSIS — M25561 Pain in right knee: Secondary | ICD-10-CM | POA: Diagnosis not present

## 2022-12-26 DIAGNOSIS — M170 Bilateral primary osteoarthritis of knee: Secondary | ICD-10-CM | POA: Diagnosis not present

## 2022-12-26 DIAGNOSIS — R2689 Other abnormalities of gait and mobility: Secondary | ICD-10-CM | POA: Diagnosis not present

## 2022-12-26 DIAGNOSIS — M0579 Rheumatoid arthritis with rheumatoid factor of multiple sites without organ or systems involvement: Secondary | ICD-10-CM | POA: Diagnosis not present

## 2023-01-21 ENCOUNTER — Ambulatory Visit: Payer: 59 | Admitting: Family

## 2023-01-21 ENCOUNTER — Ambulatory Visit (INDEPENDENT_AMBULATORY_CARE_PROVIDER_SITE_OTHER): Payer: 59 | Admitting: Family Medicine

## 2023-01-21 ENCOUNTER — Encounter: Payer: Self-pay | Admitting: Family Medicine

## 2023-01-21 VITALS — BP 124/84 | HR 85 | Temp 98.4°F | Ht 67.0 in | Wt 169.0 lb

## 2023-01-21 DIAGNOSIS — J989 Respiratory disorder, unspecified: Secondary | ICD-10-CM | POA: Insufficient documentation

## 2023-01-21 LAB — POC COVID19 BINAXNOW: SARS Coronavirus 2 Ag: NEGATIVE

## 2023-01-21 MED ORDER — GUAIFENESIN-CODEINE 100-10 MG/5ML PO SOLN: 10.0000 mL | Freq: Three times a day (TID) | ORAL | 0 refills | Status: DC | PRN

## 2023-01-21 NOTE — Patient Instructions (Signed)
Nice to see you. You likely have a viral illness that is contributing your symptoms and should continue to improve over the next several days.  If your ear pain gets worse again please let us know and we could consider an antibiotic. I sent in a cough syrup for you.  This can make you drowsy so please do not drive while taking this. If your symptoms worsen at all please let us know. You can try Debrox over-the-counter to help soften up the wax in your right ear.

## 2023-01-21 NOTE — Progress Notes (Signed)
Tommi Rumps, MD Phone: [phone]  Gereldine Nuccio is a 57 y.o. female who presents today for same-day visit.  Cough/ear pain: Patient notes this has been going on since Friday.  She notes she had fevers and chills as well as some right ear discomfort and fullness.  She has had cough with postnasal drip and some sore throat.  Sore throat has been improved some.  She notes she feels fatigued.  No shortness of breath or wheezing.  No known COVID exposures.  Notes her daughter had similar symptoms and was diagnosed with an ear infection.  Notes she has not had any fevers since yesterday.  Notes her right ear discomfort has been improving some.  Notes overall she feels better than yesterday.  Social History   Tobacco Use  Smoking Status Never  Smokeless Tobacco Never    Current Outpatient Medications on File Prior to Visit  Medication Sig Dispense Refill   Biotin 1000 MCG tablet Take 1,000 mcg by mouth daily.     calcium carbonate (OS-CAL) 600 MG TABS tablet Take 600 mg by mouth daily with breakfast.     cholecalciferol (VITAMIN D3) 25 MCG (1000 UNIT) tablet Take 1,000 Units by mouth daily.     diclofenac Sodium (VOLTAREN) 1 % GEL Apply 2 g topically 2 (two) times daily.     folic acid (FOLVITE) 1 MG tablet Take 2 tablets (2 mg total) by mouth daily. 180 tablet 2   HUMIRA PEN 40 MG/0.4ML PNKT SMARTSIG:40 Milligram(s) SUB-Q Every 2 Weeks     levothyroxine (SYNTHROID) 100 MCG tablet TAKE 1 TABLET BY MOUTH ONCE DAILY IN THE MORNING BEFORE BREAKFAST 90 tablet 0   methotrexate (RHEUMATREX) 2.5 MG tablet TAKE 8 TABLETS BY MOUTH EVERY 7 DAYS     predniSONE (DELTASONE) 10 MG tablet 6 tablets on Day 1 , then reduce by 1 tablet daily until gone 21 tablet 0   sulindac (CLINORIL) 200 MG tablet Take 200 mg by mouth 2 (two) times daily.     tretinoin (RETIN-A) 0.025 % cream Apply topically at bedtime. Apply pea sized amount to face nightly as tolerated. Can use follow with moisturizer 20 g 11   No  current facility-administered medications on file prior to visit.     ROS see history of present illness  Objective  Physical Exam Vitals:   01/21/23 1342  BP: 124/84  Pulse: 85  Temp: 98.4 F (36.9 C)  SpO2: 98%    BP Readings from Last 3 Encounters:  01/21/23 124/84  12/09/22 (!) 144/80  11/20/22 122/85   Wt Readings from Last 3 Encounters:  01/21/23 169 lb (76.7 kg)  07/27/22 172 lb 9.6 oz (78.3 kg)  07/23/21 166 lb 6.4 oz (75.5 kg)    Physical Exam Constitutional:      General: She is not in acute distress.    Appearance: She is not diaphoretic.  HENT:     Ears:     Comments: TMs obscured by cerumen bilaterally    Mouth/Throat:     Mouth: Mucous membranes are moist.     Pharynx: Posterior oropharyngeal erythema present. No oropharyngeal exudate.  Cardiovascular:     Rate and Rhythm: Normal rate and regular rhythm.     Heart sounds: Normal heart sounds.  Pulmonary:     Effort: Pulmonary effort is normal.     Breath sounds: Normal breath sounds.  Skin:    General: Skin is warm and dry.  Neurological:     Mental Status: She is alert.  Assessment/Plan: Please see individual problem list.  Respiratory illness Assessment & Plan: Patient likely has a viral respiratory illness.  Rapid COVID test negative today.  Patient has been improving and thus I think she will continue to improve if this is a viral respiratory illness.  I was not able to get a good look in her right ear where she has been having the discomfort and we did briefly discuss empirically treating for an ear infection though opted not to do this given that her ear discomfort has been improving.  If she has any worsening ear pain she will let us know and we could start an antibiotic.  If her symptoms worsen at any point she will let us know.  I did prescribe codeine cough syrup for her to use if she really needed it for her cough.  Advised on the risk of drowsiness with this  medication.  Orders: -     guaiFENesin-Codeine; Take 10 mLs by mouth 3 (three) times daily as needed for cough.  Dispense: 120 mL; Refill: 0 -     POC COVID-19 BinaxNow    Return if symptoms worsen or fail to improve.   Tommi Rumps, MD [facility]

## 2023-01-21 NOTE — Assessment & Plan Note (Signed)
Patient likely has a viral respiratory illness.  Rapid COVID test negative today.  Patient has been improving and thus I think she will continue to improve if this is a viral respiratory illness.  I was not able to get a good look in her right ear where she has been having the discomfort and we did briefly discuss empirically treating for an ear infection though opted not to do this given that her ear discomfort has been improving.  If she has any worsening ear pain she will let us know and we could start an antibiotic.  If her symptoms worsen at any point she will let us know.  I did prescribe codeine cough syrup for her to use if she really needed it for her cough.  Advised on the risk of drowsiness with this medication.

## 2023-02-01 ENCOUNTER — Ambulatory Visit
Admission: EM | Admit: 2023-02-01 | Discharge: 2023-02-01 | Disposition: A | Discharge status: Home / Self Care | Payer: 59 | Attending: Urgent Care | Admitting: Urgent Care

## 2023-02-01 DIAGNOSIS — J209 Acute bronchitis, unspecified: Secondary | ICD-10-CM

## 2023-02-01 LAB — POCT RAPID STREP A (OFFICE): Rapid Strep A Screen: NEGATIVE

## 2023-02-01 MED ORDER — PREDNISONE 10 MG (21) PO TBPK
ORAL_TABLET | ORAL | 0 refills | Status: DC
Start: 2023-02-01 — End: 2023-07-29

## 2023-02-01 MED ORDER — BENZONATATE 100 MG PO CAPS
ORAL_CAPSULE | ORAL | 0 refills | Status: DC
Start: 2023-02-01 — End: 2023-03-09

## 2023-02-01 NOTE — ED Triage Notes (Signed)
Patient presents to UC for sore throat and cough x 1 week. States she was seen by her PCP who prescribed codeine cough med. Instructed her to follow-up for antibiotic treatment if not improving. States she is taking allergy meds. Exposed to strep, requesting testing.

## 2023-02-01 NOTE — Discharge Instructions (Signed)
Follow-up here or with your primary care provider if you develop upper respiratory symptoms, or worsening symptoms of infection.

## 2023-02-01 NOTE — ED Provider Notes (Addendum)
Renaldo Fiddler    CSN: [id number] Arrival date & time: 02/01/23  0843      History   Chief Complaint Chief Complaint  Patient presents with   Sore Throat   Cough    HPI Melissa Baker is a 57 y.o. female.    Sore Throat  Cough   Presents to urgent care with complaint of sore throat and cough x 1 week.  Patient states she was evaluated by her PCP who prescribed codeine-based cough medication and instructed her to follow-up for antibiotic treatment if not improving.  Patient was seen 11 days ago, 01/21/2023 at her PCP with complaint of fever and chills, right ear discomfort and fullness.  Denying upper respiratory symptoms.  No nasal congestion.  Does feel some "ear fullness".  Cough is moderately productive, but is not able to describe sputum.  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   Myalgia 06/07/2021   Grief reaction 06/07/2021   Encounter for preventive health examination 07/13/2019   Encounter for Papanicolaou smear for cervical cancer screening 07/13/2019   Encounter for screening mammogram for breast cancer 07/13/2019   Hyperlipidemia LDL goal <130 07/13/2019   Carpal tunnel syndrome on both sides 07/13/2019   Rheumatoid arthritis 07/13/2019   Chronic pain of left wrist 04/11/2019   Hypothyroidism 09/26/2018   Bilateral lower extremity edema 06/23/2018   Varicose veins of both lower extremities with pain 06/23/2018   Acute pain of left knee 09/19/2017    Class: Acute    Past Surgical History:  Procedure Laterality Date   NO PAST SURGERIES      OB History   No obstetric history on file.      Home Medications    Prior to Admission medications   Medication Sig Start Date End Date  Taking? Authorizing Provider  Biotin 1000 MCG tablet Take 1,000 mcg by mouth daily.    [provider]  calcium carbonate (OS-CAL) 600 MG TABS tablet Take 600 mg by mouth daily with breakfast.    [provider]  cholecalciferol (VITAMIN D3) 25 MCG (1000 UNIT) tablet Take 1,000 Units by mouth daily.    [provider]  diclofenac Sodium (VOLTAREN) 1 % GEL Apply 2 g topically 2 (two) times daily. 04/19/20   [provider]  folic acid (FOLVITE) 1 MG tablet Take 2 tablets (2 mg total) by mouth daily. 06/26/21   Sherlene Shams, MD  guaiFENesin-codeine 100-10 MG/5ML syrup Take 10 mLs by mouth 3 (three) times daily as needed for cough. 01/21/23   Glori Luis, MD  HUMIRA PEN 40 MG/0.4ML PNKT SMARTSIG:40 Milligram(s) SUB-Q Every 2 Weeks 06/26/20   [provider]  levothyroxine (SYNTHROID) 100 MCG tablet TAKE 1 TABLET BY MOUTH ONCE DAILY IN THE MORNING BEFORE BREAKFAST 12/12/22   Sherlene Shams, MD  methotrexate (RHEUMATREX) 2.5 MG tablet TAKE 8 TABLETS BY MOUTH EVERY 7 DAYS 04/23/18   [provider]  predniSONE (DELTASONE) 10 MG tablet 6 tablets on Day 1 , then reduce by 1 tablet daily until gone 07/25/22   Sherlene Shams, MD  sulindac (CLINORIL) 200 MG tablet Take 200 mg by mouth 2 (two) times daily. 01/16/22   [provider]  tretinoin (RETIN-A) 0.025 %  cream Apply topically at bedtime. Apply pea sized amount to face nightly as tolerated. Can use follow with moisturizer 11/24/22 11/24/23  Deirdre Evener, MD    Family History Family History  Problem Relation Age of Onset   Varicose Veins Mother    Arthritis Mother    Heart failure Father     Social History Social History   Tobacco Use   Smoking status: Never   Smokeless tobacco: Never  Substance Use Topics   Alcohol use: Never   Drug use: Never     Allergies   Patient has no known allergies.   Review of Systems Review of Systems  Respiratory:  Positive for cough.       Physical Exam Triage Vital Signs ED Triage Vitals  Enc Vitals Group     BP 02/01/23 0922 116/77     Pulse Rate 02/01/23 0922 77     Resp 02/01/23 0922 18     Temp 02/01/23 0922 98.1 F (36.7 C)     Temp Source 02/01/23 0922 Oral     SpO2 02/01/23 0922 95 %     Weight --      Height --      Head Circumference --      Peak Flow --      Pain Score 02/01/23 0929 0     Pain Loc --      Pain Edu? --      Excl. in GC? --    No data found.  Updated Vital Signs BP 116/77 (BP Location: Left Arm)   Pulse 77   Temp 98.1 F (36.7 C) (Oral)   Resp 18   SpO2 95%   Visual Acuity Right Eye Distance:   Left Eye Distance:   Bilateral Distance:    Right Eye Near:   Left Eye Near:    Bilateral Near:     Physical Exam Vitals reviewed.  Constitutional:      Appearance: She is well-developed.  HENT:     Mouth/Throat:     Mouth: Mucous membranes are moist.     Pharynx: Posterior oropharyngeal erythema present. No oropharyngeal exudate.  Cardiovascular:     Rate and Rhythm: Normal rate and regular rhythm.     Heart sounds: Normal heart sounds.  Pulmonary:     Effort: Pulmonary effort is normal.     Breath sounds: Normal breath sounds.  Skin:    General: Skin is warm and dry.  Neurological:     General: No focal deficit present.     Mental Status: She is alert and oriented to person, place, and time.  Psychiatric:        Mood and Affect: Mood normal.        Behavior: Behavior normal.      UC Treatments / Results  Labs (all labs ordered are listed, but only abnormal results are displayed) Labs Reviewed  POCT RAPID STREP A (OFFICE)    EKG   Radiology No results found.  Procedures Procedures (including critical care time)  Medications Ordered in UC Medications - No data to display  Initial Impression / Assessment and Plan / UC Course  I have reviewed the triage vital signs and the nursing notes.  Pertinent labs & imaging results that were available  during my care of the patient were reviewed by me and considered in my medical decision making (see chart for details).   Trinda Harlacher is a 57 y.o. female presenting with cough. Patient is afebrile without recent  antipyretics, satting well on room air. Overall is well appearing though non-toxic, well hydrated, without respiratory distress. Pulmonary exam is unremarkable.  Lungs CTAB without wheezing, rhonchi, rales.  Rapid strep is negative.  Patient denies any significant upper respiratory symptoms other than feeling of ear "fullness".  Suspect post viral cough/bronchitis and possibly residual sinusitis.  Patient endorses poor response to codeine based cough syrup prescribed by her provider as well as NyQuil she uses over-the-counter.  She states both of these medications over sedate her and so uses no treatment at night.  Will prescribe benzonatate for cough as well as a course of prednisone.  Counseled patient on potential for adverse effects with medications prescribed/recommended today, ER and return-to-clinic precautions discussed, patient verbalized understanding and agreement with care plan.   Final Clinical Impressions(s) / UC Diagnoses   Final diagnoses:  None   Discharge Instructions   None    ED Prescriptions   None    PDMP not reviewed this encounter.   Charma Igo, FNP 02/01/23 0950    Charma Igo, FNP 02/01/23 [phone]

## 2023-02-02 ENCOUNTER — Ambulatory Visit: Payer: 59 | Admitting: Family Medicine

## 2023-02-03 ENCOUNTER — Encounter: Payer: Self-pay | Admitting: Internal Medicine

## 2023-02-05 ENCOUNTER — Other Ambulatory Visit: Payer: Self-pay | Admitting: Internal Medicine

## 2023-02-05 DIAGNOSIS — N950 Postmenopausal bleeding: Secondary | ICD-10-CM

## 2023-02-12 ENCOUNTER — Ambulatory Visit: Payer: 59 | Admitting: Dermatology

## 2023-03-05 ENCOUNTER — Telehealth: Payer: Self-pay | Admitting: Internal Medicine

## 2023-03-05 DIAGNOSIS — M958 Other specified acquired deformities of musculoskeletal system: Secondary | ICD-10-CM | POA: Diagnosis not present

## 2023-03-05 DIAGNOSIS — M0579 Rheumatoid arthritis with rheumatoid factor of multiple sites without organ or systems involvement: Secondary | ICD-10-CM | POA: Diagnosis not present

## 2023-03-05 DIAGNOSIS — M19071 Primary osteoarthritis, right ankle and foot: Secondary | ICD-10-CM | POA: Diagnosis not present

## 2023-03-05 NOTE — Telephone Encounter (Signed)
Pt walked in asking if Dr.Tullo can order the meningitis shot for her. As per pt, she's traveling on 02/23/23, and the requirements for the shot is to have proof of that vaccine. Pt would like a phone call regarding this issue.

## 2023-03-06 ENCOUNTER — Encounter: Payer: Self-pay | Admitting: Internal Medicine

## 2023-03-06 NOTE — Telephone Encounter (Signed)
See my chart message

## 2023-03-09 ENCOUNTER — Ambulatory Visit (INDEPENDENT_AMBULATORY_CARE_PROVIDER_SITE_OTHER): Payer: 59

## 2023-03-09 ENCOUNTER — Telehealth: Payer: Self-pay | Admitting: Internal Medicine

## 2023-03-09 DIAGNOSIS — M170 Bilateral primary osteoarthritis of knee: Secondary | ICD-10-CM | POA: Diagnosis not present

## 2023-03-09 DIAGNOSIS — Z23 Encounter for immunization: Secondary | ICD-10-CM

## 2023-03-09 DIAGNOSIS — G8929 Other chronic pain: Secondary | ICD-10-CM | POA: Diagnosis not present

## 2023-03-09 DIAGNOSIS — J209 Acute bronchitis, unspecified: Secondary | ICD-10-CM

## 2023-03-09 MED ORDER — BENZONATATE 100 MG PO CAPS
ORAL_CAPSULE | ORAL | 1 refills | Status: DC
Start: 2023-03-09 — End: 2023-07-29

## 2023-03-09 NOTE — Progress Notes (Signed)
Pt presented for the meningococcal vaccine. Pt was identified through two identifiers. Pt tolerated injection well in the left deltoid.   Pt was given the Vaccine information sheet & she informed me this is her first meningitis vaccine. I allowed pt to rest for about 15 minutes to see if she had any side effects to the vaccine.    After periodically checking on pt during the wait, she seemed fine each time verbalizing that she was okay and did not want to stay the whole 15 I told her give me at least 3 more minutes and she verbalized understanding and stated she is feeling good.    Pt showed me a form that she needed filled out in order to go out [place] pt stated all she needed was the lot number of the vaccine and expiration date. After documenting the vaccine I was able to print off her vaccine record to provide her with that information.   Pt was released and vaccine order form has been placed in provider quick sign folder.

## 2023-03-09 NOTE — Telephone Encounter (Signed)
Prescription Request  03/09/2023  LOV: 07/25/2022  What is the name of the medication or equipment? benzonatate (TESSALON) 100 MG capsule  Have you contacted your pharmacy to request a refill? Yes   Which pharmacy would you like this sent to?   [facility] [address], [place] - [address] [place] [address] Phone: [phone] Fax: [phone]   Patient notified that their request is being sent to the clinical staff for review and that they should receive a response within 2 business days.   Please advise at Mobile [phone] (mobile)   Pt also need an anti-diahrea med, as per pt, she's will be going out of the country.

## 2023-03-09 NOTE — Telephone Encounter (Signed)
Tessalon refilled °

## 2023-03-10 ENCOUNTER — Ambulatory Visit: Payer: 59 | Admitting: Dermatology

## 2023-03-10 DIAGNOSIS — L988 Other specified disorders of the skin and subcutaneous tissue: Secondary | ICD-10-CM

## 2023-03-10 MED ORDER — CIPROFLOXACIN HCL 500 MG PO TABS: 500.0000 mg | ORAL_TABLET | Freq: Two times a day (BID) | ORAL | 0 refills | Status: AC

## 2023-03-10 NOTE — Patient Instructions (Signed)
Due to recent changes in healthcare laws, you may see results of your pathology and/or laboratory studies on MyChart before the doctors have had a chance to review them. We understand that in some cases there may be results that are confusing or concerning to you. Please understand that not all results are received at the same time and often the doctors may need to interpret multiple results in order to provide you with the best plan of care or course of treatment. Therefore, we ask that you please give us 2 business days to thoroughly review all your results before contacting the office for clarification. Should we see a critical lab result, you will be contacted sooner.   If You Need Anything After Your Visit  If you have any questions or concerns for your doctor, please call our main line at [phone] and press option 4 to reach your doctor's medical assistant. If no one answers, please leave a voicemail as directed and we will return your call as soon as possible. Messages left after 4 pm will be answered the following business day.   You may also send us a message via MyChart. We typically respond to MyChart messages within 1-2 business days.  For prescription refills, please ask your pharmacy to contact our office. Our fax number is [phone].  If you have an urgent issue when the clinic is closed that cannot wait until the next business day, you can page your doctor at the number below.    Please note that while we do our best to be available for urgent issues outside of office hours, we are not available 24/7.   If you have an urgent issue and are unable to reach us, you may choose to seek medical care at your doctor's office, retail clinic, urgent care center, or emergency room.  If you have a medical emergency, please immediately call 911 or go to the emergency department.  Pager Numbers  - Dr. Kowalski: [phone]  - Dr. Moye: [phone]  - Dr. Stewart:  [phone]  In the event of inclement weather, please call our main line at [phone] for an update on the status of any delays or closures.  Dermatology Medication Tips: Please keep the boxes that topical medications come in in order to help keep track of the instructions about where and how to use these. Pharmacies typically print the medication instructions only on the boxes and not directly on the medication tubes.   If your medication is too expensive, please contact our office at [phone] option 4 or send us a message through MyChart.   We are unable to tell what your co-pay for medications will be in advance as this is different depending on your insurance coverage. However, we may be able to find a substitute medication at lower cost or fill out paperwork to get insurance to cover a needed medication.   If a prior authorization is required to get your medication covered by your insurance company, please allow us 1-2 business days to complete this process.  Drug prices often vary depending on where the prescription is filled and some pharmacies may offer cheaper prices.  The website www.goodrx.com contains coupons for medications through different pharmacies. The prices here do not account for what the cost may be with help from insurance (it may be cheaper with your insurance), but the website can give you the price if you did not use any insurance.  - You can print the associated coupon and take it with   your prescription to the pharmacy.  - You may also stop by our office during regular business hours and pick up a GoodRx coupon card.  - If you need your prescription sent electronically to a different pharmacy, notify our office through [facility] MyChart or by phone at [phone] option 4.     Si Usted Necesita Algo Despus de Su Visita  Tambin puede enviarnos un mensaje a travs de MyChart. Por lo general respondemos a los mensajes de MyChart en el transcurso de 1 a 2  das hbiles.  Para renovar recetas, por favor pida a su farmacia que se ponga en contacto con nuestra oficina. Nuestro nmero de fax es el [phone].  Si tiene un asunto urgente cuando la clnica est cerrada y que no puede esperar hasta el siguiente da hbil, puede llamar/localizar a su doctor(a) al nmero que aparece a continuacin.   Por favor, tenga en cuenta que aunque hacemos todo lo posible para estar disponibles para asuntos urgentes fuera del horario de oficina, no estamos disponibles las 24 horas del da, los 7 das de la semana.   Si tiene un problema urgente y no puede comunicarse con nosotros, puede optar por buscar atencin mdica  en el consultorio de su doctor(a), en una clnica privada, en un centro de atencin urgente o en una sala de emergencias.  Si tiene una emergencia mdica, por favor llame inmediatamente al 911 o vaya a la sala de emergencias.  Nmeros de bper  - Dr. Kowalski: [phone]  - Dra. Moye: [phone]  - Dra. Stewart: [phone]  En caso de inclemencias del tiempo, por favor llame a nuestra lnea principal al [phone] para una actualizacin sobre el estado de cualquier retraso o cierre.  Consejos para la medicacin en dermatologa: Por favor, guarde las cajas en las que vienen los medicamentos de uso tpico para ayudarle a seguir las instrucciones sobre dnde y cmo usarlos. Las farmacias generalmente imprimen las instrucciones del medicamento slo en las cajas y no directamente en los tubos del medicamento.   Si su medicamento es muy caro, por favor, pngase en contacto con nuestra oficina llamando al [phone] y presione la opcin 4 o envenos un mensaje a travs de MyChart.   No podemos decirle cul ser su copago por los medicamentos por adelantado ya que esto es diferente dependiendo de la cobertura de su seguro. Sin embargo, es posible que podamos encontrar un medicamento sustituto a menor costo o llenar un formulario para que el  seguro cubra el medicamento que se considera necesario.   Si se requiere una autorizacin previa para que su compaa de seguros cubra su medicamento, por favor permtanos de 1 a 2 das hbiles para completar este proceso.  Los precios de los medicamentos varan con frecuencia dependiendo del lugar de dnde se surte la receta y alguna farmacias pueden ofrecer precios ms baratos.  El sitio web www.goodrx.com tiene cupones para medicamentos de diferentes farmacias. Los precios aqu no tienen en cuenta lo que podra costar con la ayuda del seguro (puede ser ms barato con su seguro), pero el sitio web puede darle el precio si no utiliz ningn seguro.  - Puede imprimir el cupn correspondiente y llevarlo con su receta a la farmacia.  - Tambin puede pasar por nuestra oficina durante el horario de atencin regular y recoger una tarjeta de cupones de GoodRx.  - Si necesita que su receta se enve electrnicamente a una farmacia diferente, informe a nuestra oficina a travs de MyChart de [place]   o por telfono llamando al [phone] y presione la opcin 4.  

## 2023-03-10 NOTE — Telephone Encounter (Signed)
Pt would also like to see about having an anti-diarrheal medication to take with her since [place].

## 2023-03-10 NOTE — Telephone Encounter (Signed)
Pt stated that she has been told by other people that she needs to take Cipro with her incase she develops diarrhea.

## 2023-03-10 NOTE — Progress Notes (Signed)
   Follow-Up Visit   Subjective  Rosella Sprayberry is a 57 y.o. female who presents for the following: Botox for facial elastosis  The following portions of the chart were reviewed this encounter and updated as appropriate: medications, allergies, medical history  Review of Systems:  No other skin or systemic complaints except as noted in HPI or Assessment and Plan.  Objective  Well appearing patient in no apparent distress; mood and affect are within normal limits.  A focused examination was performed of the face.  Relevant physical exam findings are noted in the Assessment and Plan    Assessment & Plan     Facial Elastosis  Location: See attached image  Informed consent: Discussed risks (infection, pain, bleeding, bruising, swelling, allergic reaction, paralysis of nearby muscles, eyelid droop, double vision, neck weakness, difficulty breathing, headache, undesirable cosmetic result, and need for additional treatment) and benefits of the procedure, as well as the alternatives.  Informed consent was obtained.  Preparation: The area was cleansed with alcohol.  Procedure Details:  Botox was injected into the dermis with a 30-gauge needle. Pressure applied to any bleeding. Ice packs offered for swelling.  Lot Number:  [id number] Expiration:  02/2025  Total Units Injected:  20  Plan: Tylenol may be used for headache.  Allow 2 weeks before returning to clinic for additional dosing as needed. Patient will call for any problems.  Return in about 3 months (around 06/10/2023) for Botox injections.  Maylene Roes, CMA, am acting as scribe for Armida Sans, MD .  Documentation: I have reviewed the above documentation for accuracy and completeness, and I agree with the above.  Armida Sans, MD

## 2023-03-10 NOTE — Telephone Encounter (Signed)
Pt is aware and gave a verbal understanding.  

## 2023-03-10 NOTE — Telephone Encounter (Signed)
Spoke with pt and she stated that Imodium does not work for her. She wants to know if there is anything else that she can get.

## 2023-03-10 NOTE — Addendum Note (Signed)
Addended by: Sherlene Shams on: 03/10/2023 03:38 PM   Modules accepted: Orders

## 2023-03-17 ENCOUNTER — Ambulatory Visit: Payer: 59 | Admitting: Dermatology

## 2023-03-20 ENCOUNTER — Encounter: Payer: Self-pay | Admitting: Dermatology

## 2023-03-31 ENCOUNTER — Other Ambulatory Visit: Payer: Self-pay | Admitting: Internal Medicine

## 2023-03-31 DIAGNOSIS — E039 Hypothyroidism, unspecified: Secondary | ICD-10-CM

## 2023-04-21 DIAGNOSIS — M170 Bilateral primary osteoarthritis of knee: Secondary | ICD-10-CM | POA: Diagnosis not present

## 2023-04-26 ENCOUNTER — Encounter: Payer: Self-pay | Admitting: Internal Medicine

## 2023-04-26 DIAGNOSIS — Z1211 Encounter for screening for malignant neoplasm of colon: Secondary | ICD-10-CM

## 2023-04-27 DIAGNOSIS — N950 Postmenopausal bleeding: Secondary | ICD-10-CM | POA: Diagnosis not present

## 2023-04-27 NOTE — Telephone Encounter (Signed)
You would have to call the company and let them know what happened to see if they will send you a new kit.   Shanda Bumps, CMA

## 2023-04-28 DIAGNOSIS — M170 Bilateral primary osteoarthritis of knee: Secondary | ICD-10-CM | POA: Diagnosis not present

## 2023-04-28 DIAGNOSIS — Z7960 Long term (current) use of unspecified immunomodulators and immunosuppressants: Secondary | ICD-10-CM | POA: Diagnosis not present

## 2023-04-28 DIAGNOSIS — M0579 Rheumatoid arthritis with rheumatoid factor of multiple sites without organ or systems involvement: Secondary | ICD-10-CM | POA: Diagnosis not present

## 2023-06-03 DIAGNOSIS — M25562 Pain in left knee: Secondary | ICD-10-CM | POA: Diagnosis not present

## 2023-06-03 DIAGNOSIS — M25561 Pain in right knee: Secondary | ICD-10-CM | POA: Diagnosis not present

## 2023-06-03 DIAGNOSIS — R262 Difficulty in walking, not elsewhere classified: Secondary | ICD-10-CM | POA: Diagnosis not present

## 2023-06-05 DIAGNOSIS — Z1211 Encounter for screening for malignant neoplasm of colon: Secondary | ICD-10-CM | POA: Diagnosis not present

## 2023-06-08 DIAGNOSIS — R262 Difficulty in walking, not elsewhere classified: Secondary | ICD-10-CM | POA: Diagnosis not present

## 2023-06-08 DIAGNOSIS — M25562 Pain in left knee: Secondary | ICD-10-CM | POA: Diagnosis not present

## 2023-06-08 DIAGNOSIS — M25561 Pain in right knee: Secondary | ICD-10-CM | POA: Diagnosis not present

## 2023-06-10 LAB — COLOGUARD: COLOGUARD: NEGATIVE

## 2023-06-11 DIAGNOSIS — M25561 Pain in right knee: Secondary | ICD-10-CM | POA: Diagnosis not present

## 2023-06-11 DIAGNOSIS — R262 Difficulty in walking, not elsewhere classified: Secondary | ICD-10-CM | POA: Diagnosis not present

## 2023-06-11 DIAGNOSIS — M25562 Pain in left knee: Secondary | ICD-10-CM | POA: Diagnosis not present

## 2023-06-15 DIAGNOSIS — N950 Postmenopausal bleeding: Secondary | ICD-10-CM | POA: Diagnosis not present

## 2023-06-16 DIAGNOSIS — M25562 Pain in left knee: Secondary | ICD-10-CM | POA: Diagnosis not present

## 2023-06-16 DIAGNOSIS — R262 Difficulty in walking, not elsewhere classified: Secondary | ICD-10-CM | POA: Diagnosis not present

## 2023-06-16 DIAGNOSIS — M25561 Pain in right knee: Secondary | ICD-10-CM | POA: Diagnosis not present

## 2023-06-18 DIAGNOSIS — R262 Difficulty in walking, not elsewhere classified: Secondary | ICD-10-CM | POA: Diagnosis not present

## 2023-06-18 DIAGNOSIS — M25561 Pain in right knee: Secondary | ICD-10-CM | POA: Diagnosis not present

## 2023-06-18 DIAGNOSIS — M25562 Pain in left knee: Secondary | ICD-10-CM | POA: Diagnosis not present

## 2023-06-25 DIAGNOSIS — M25561 Pain in right knee: Secondary | ICD-10-CM | POA: Diagnosis not present

## 2023-06-25 DIAGNOSIS — M25562 Pain in left knee: Secondary | ICD-10-CM | POA: Diagnosis not present

## 2023-06-25 DIAGNOSIS — R262 Difficulty in walking, not elsewhere classified: Secondary | ICD-10-CM | POA: Diagnosis not present

## 2023-06-29 DIAGNOSIS — M25561 Pain in right knee: Secondary | ICD-10-CM | POA: Diagnosis not present

## 2023-06-29 DIAGNOSIS — M25562 Pain in left knee: Secondary | ICD-10-CM | POA: Diagnosis not present

## 2023-06-29 DIAGNOSIS — R262 Difficulty in walking, not elsewhere classified: Secondary | ICD-10-CM | POA: Diagnosis not present

## 2023-07-02 DIAGNOSIS — R262 Difficulty in walking, not elsewhere classified: Secondary | ICD-10-CM | POA: Diagnosis not present

## 2023-07-02 DIAGNOSIS — M25562 Pain in left knee: Secondary | ICD-10-CM | POA: Diagnosis not present

## 2023-07-02 DIAGNOSIS — M25561 Pain in right knee: Secondary | ICD-10-CM | POA: Diagnosis not present

## 2023-07-06 DIAGNOSIS — M25561 Pain in right knee: Secondary | ICD-10-CM | POA: Diagnosis not present

## 2023-07-06 DIAGNOSIS — R262 Difficulty in walking, not elsewhere classified: Secondary | ICD-10-CM | POA: Diagnosis not present

## 2023-07-06 DIAGNOSIS — M25562 Pain in left knee: Secondary | ICD-10-CM | POA: Diagnosis not present

## 2023-07-07 ENCOUNTER — Ambulatory Visit: Payer: 59 | Admitting: Dermatology

## 2023-07-09 DIAGNOSIS — M25561 Pain in right knee: Secondary | ICD-10-CM | POA: Diagnosis not present

## 2023-07-09 DIAGNOSIS — R262 Difficulty in walking, not elsewhere classified: Secondary | ICD-10-CM | POA: Diagnosis not present

## 2023-07-09 DIAGNOSIS — M25562 Pain in left knee: Secondary | ICD-10-CM | POA: Diagnosis not present

## 2023-07-13 DIAGNOSIS — R262 Difficulty in walking, not elsewhere classified: Secondary | ICD-10-CM | POA: Diagnosis not present

## 2023-07-13 DIAGNOSIS — M25562 Pain in left knee: Secondary | ICD-10-CM | POA: Diagnosis not present

## 2023-07-13 DIAGNOSIS — M25561 Pain in right knee: Secondary | ICD-10-CM | POA: Diagnosis not present

## 2023-07-14 ENCOUNTER — Ambulatory Visit (INDEPENDENT_AMBULATORY_CARE_PROVIDER_SITE_OTHER): Payer: 59 | Admitting: Dermatology

## 2023-07-14 DIAGNOSIS — L811 Chloasma: Secondary | ICD-10-CM | POA: Diagnosis not present

## 2023-07-14 DIAGNOSIS — Z7189 Other specified counseling: Secondary | ICD-10-CM

## 2023-07-14 DIAGNOSIS — L988 Other specified disorders of the skin and subcutaneous tissue: Secondary | ICD-10-CM

## 2023-07-14 DIAGNOSIS — Z79899 Other long term (current) drug therapy: Secondary | ICD-10-CM

## 2023-07-14 MED ORDER — TRETINOIN 0.025 % EX CREA: TOPICAL_CREAM | CUTANEOUS | 11 refills | Status: AC

## 2023-07-14 NOTE — Patient Instructions (Addendum)
Your prescription for Tretinoin 0.025% cream was sent to [facility] in [place]. A representative from [employer] will contact you within 2 business hours to verify your address and insurance information to schedule a free delivery. If for any reason you do not receive a phone call from them, please reach out to them. Their phone number is [phone] and their hours are Monday-Friday 9:00 am-5:00 pm.    Instructions for [facility] Medications  One or more of your medications was sent to the [facility] mail order compounding pharmacy. You will receive an email from them and can purchase the medicine through that link. It will then be mailed to your home at the address you confirmed. If for any reason you do not receive an email from them, please check your spam folder. If you still do not find the email, please let us know. [facility] phone number is [phone].    Due to recent changes in healthcare laws, you may see results of your pathology and/or laboratory studies on MyChart before the doctors have had a chance to review them. We understand that in some cases there may be results that are confusing or concerning to you. Please understand that not all results are received at the same time and often the doctors may need to interpret multiple results in order to provide you with the best plan of care or course of treatment. Therefore, we ask that you please give [place] 2 business days to thoroughly review all your results before contacting the office for clarification. Should we see a critical lab result, you will be contacted sooner.   If You Need Anything After Your Visit  If you have any questions or concerns for your doctor, please call our main line at [phone] and press option 4 to reach your doctor's medical assistant. If no one answers, please leave a voicemail as directed and we will return your call as soon as possible. Messages left after 4 pm will be answered  the following business day.   You may also send [place] a message via MyChart. We typically respond to MyChart messages within 1-2 business days.  For prescription refills, please ask your pharmacy to contact our office. Our fax number is [phone].  If you have an urgent issue when the clinic is closed that cannot wait until the next business day, you can page your doctor at the number below.    Please note that while we do our best to be available for urgent issues outside of office hours, we are not available 24/7.   If you have an urgent issue and are unable to reach [place], you may choose to seek medical care at your doctor's office, retail clinic, urgent care center, or emergency room.  If you have a medical emergency, please immediately call 911 or go to the emergency department.  Pager Numbers  - Dr. Gwen Pounds: [phone]  - Dr. Roseanne Reno: [phone]  - Dr. Katrinka Blazing: [phone]   In the event of inclement weather, please call our main line at [phone] for an update on the status of any delays or closures.  Dermatology Medication Tips: Please keep the boxes that topical medications come in in order to help keep track of the instructions about where and how to use these. Pharmacies typically print the medication instructions only on the boxes and not directly on the medication tubes.   If your medication is too expensive, please contact our office at [phone] option 4 or send [place] a  message through MyChart.   We are unable to tell what your co-pay for medications will be in advance as this is different depending on your insurance coverage. However, we may be able to find a substitute medication at lower cost or fill out paperwork to get insurance to cover a needed medication.   If a prior authorization is required to get your medication covered by your insurance company, please allow [place] 1-2 business days to complete this process.  Drug prices often vary depending on where the  prescription is filled and some pharmacies may offer cheaper prices.  The website www.goodrx.com contains coupons for medications through different pharmacies. The prices here do not account for what the cost may be with help from insurance (it may be cheaper with your insurance), but the website can give you the price if you did not use any insurance.  - You can print the associated coupon and take it with your prescription to the pharmacy.  - You may also stop by our office during regular business hours and pick up a GoodRx coupon card.  - If you need your prescription sent electronically to a different pharmacy, notify our office through [facility] or by phone at [phone] option 4.     Si Usted Necesita Algo Despus de Su Visita  Tambin puede enviarnos un mensaje a travs de Clinical cytogeneticist. Por lo general respondemos a los mensajes de MyChart en el transcurso de 1 a 2 das hbiles.  Para renovar recetas, por favor pida a su farmacia que se ponga en contacto con nuestra oficina. Annie Sable de fax es [place] [phone].  Si tiene un asunto urgente cuando la clnica est cerrada y que no puede esperar hasta el siguiente da hbil, puede llamar/localizar a su doctor(a) al nmero que aparece a continuacin.   Por favor, tenga en cuenta que aunque hacemos todo lo posible para estar disponibles para asuntos urgentes fuera del horario de [address], no estamos disponibles las 24 horas del da, los 7 [address]  [address] de la [place].   Si tiene un problema urgente y no puede comunicarse con nosotros, puede optar por buscar atencin mdica  en el consultorio de su doctor(a), en una clnica privada, en un centro de atencin urgente o en una sala de emergencias.  Si tiene Engineer, drilling, por favor llame inmediatamente al 911 o vaya a la sala de emergencias.  Nmeros de bper  - Dr. Gwen Pounds: [phone]  - Dra. Roseanne Reno: [phone]  - Dr. Katrinka Blazing: [phone]   En caso de inclemencias del  tiempo, por favor llame a Lacy Duverney principal al [phone] para una actualizacin sobre el [place] de cualquier retraso o cierre.  Consejos para la medicacin en dermatologa: Por favor, guarde las cajas en las que vienen los medicamentos de uso tpico para ayudarle a seguir las instrucciones sobre dnde y cmo usarlos. Las farmacias generalmente imprimen las instrucciones del medicamento slo en las cajas y no directamente en los tubos del [place].   Si su medicamento es muy caro, por favor, pngase en contacto con Rolm [place]ala llamando al [phone] y presione la opcin 4 o envenos un mensaje a travs de Clinical cytogeneticist.   No podemos decirle cul ser su copago por los medicamentos por adelantado ya que esto es diferente dependiendo de la cobertura de su seguro. Sin embargo, es posible que podamos encontrar un medicamento sustituto a Audiological scientist un formulario para que el seguro cubra el medicamento que se considera necesario.   Si se requiere  una autorizacin previa para que su compaa de seguros [place] su medicamento, por favor permtanos de 1 a 2 das hbiles para completar [address]  Los precios de los medicamentos varan con frecuencia dependiendo del Environmental consultant de dnde se surte la receta y alguna farmacias pueden ofrecer precios ms baratos.  El sitio web www.goodrx.com tiene cupones para medicamentos de Health and safety inspector. Los precios aqu no tienen en cuenta lo que podra costar con la ayuda del seguro (puede ser ms barato con su seguro), pero el sitio web puede darle el precio si no utiliz Tourist information centre manager.  - Puede imprimir el cupn correspondiente y llevarlo con su receta a la farmacia.  - Tambin puede pasar por nuestra oficina durante el horario de atencin regular y Education officer, museum una tarjeta de cupones de GoodRx.  - Si necesita que su receta se enve electrnicamente a una farmacia diferente, informe a nuestra oficina a travs de MyChart de [place] o por telfono  llamando al [phone] y presione la opcin 4.

## 2023-07-14 NOTE — Progress Notes (Unsigned)
Follow-Up Visit   Subjective  Melissa Baker is a 57 y.o. female who presents for the following: Botox for facial elastosis and melasma around the eyes. Patient state that the [facility] hydroquinone/mix with Tretinoin 0.1% does irritate her skin. She last used it about 8 months ago.   The following portions of the chart were reviewed this encounter and updated as appropriate: medications, allergies, medical history  Review of Systems:  No other skin or systemic complaints except as noted in HPI or Assessment and Plan.  Objective  Well appearing patient in no apparent distress; mood and affect are within normal limits.  A focused examination was performed of the face.  Relevant physical exam findings are noted in the Assessment and Plan.           Assessment & Plan   Facial Elastosis  Location: See attached image  Informed consent: Discussed risks (infection, pain, bleeding, bruising, swelling, allergic reaction, paralysis of nearby muscles, eyelid droop, double vision, neck weakness, difficulty breathing, headache, undesirable cosmetic result, and need for additional treatment) and benefits of the procedure, as well as the alternatives.  Informed consent was obtained.  Preparation: The area was cleansed with alcohol.  Procedure Details:  Botox was injected into the dermis with a 30-gauge needle. Pressure applied to any bleeding. Ice packs offered for swelling.  Lot Number:  [id number] Expiration:  06/2025  Total Units Injected:  20  Plan: Tylenol may be used for headache.  Allow 2 weeks before returning to clinic for additional dosing as needed. Patient will call for any problems.  Continue Tretinoin 0.025% cream QHS. Topical retinoid medications like tretinoin/Retin-A, adapalene/Differin, tazarotene/Fabior, and Epiduo/Epiduo Forte can cause dryness and irritation when first started. Only apply a pea-sized amount to the entire affected area. Avoid applying it  around the eyes, edges of mouth and creases at the nose. If you experience irritation, use a good moisturizer first and/or apply the medicine less often. If you are doing well with the medicine, you can increase how often you use it until you are applying every night. Be careful with sun protection while using this medication as it can make you sensitive to the sun. This medicine should not be used by pregnant women.   MELASMA Exam: reticulated hyperpigmented patches at face  Chronic and persistent condition with duration or expected duration over one year. Condition is symptomatic/ bothersome to patient. Not currently at goal.  Melasma is a chronic; persistent condition of hyperpigmented patches generally on the face, worse in summer due to higher UV exposure.    Heredity; thyroid disease; sun exposure; pregnancy; birth control pills; epilepsy medication and darker skin may predispose to Melasma.   Recommendations include: - Sun avoidance and daily broad spectrum (UVA/UVB) tinted mineral sunscreen SPF 30+, with Zinc or Titanium Dioxide. - Rx topical bleaching creams (i.e. hydroquinone) is a common treatment but should not be used long term.  Hydroquinones may be mixed with retinoids; vitamin C; steroids; Kojic Acid. - Alastin A-luminate, retinoids, vitamin C, topical tranexamic acid, glycolic acid and kojic acid can be used for brightening while on break from hydroquinone - Rx Azelaic Acid is also a treatment option that is safe for pregnancy (Category B). - OTC Heliocare can be helpful in control and prevention. - Oral Rx with Tranexamic Acid 250 mg - 650 mg po daily can be used for moderate to severe cases especially during summer (contraindications include pregnancy; lactation; hx of PE; hx of DVT; clotting disorder; heart disease; anticoagulant  use and upcoming long trips)   - Chemical peels (would need multiple for best result).  - Lasers and  Microdermabrasion may also be helpful adjunct  treatments.  Treatment Plan: D/C previous [facility] hydroquinone mix due to irritation with Tretinoin 0.1% - start SM19 Hydroquinone 12% / Kojic Acid 6% / Niacinamide 2% / Vitamin C 1% Cream QD x 3 months then stop.   Reviewed risk of permanent dark spots (ochronosis) if hydroquinone is used longer than 3 months.  Return in about 4 months (around 11/13/2023) for Botox injections.  Maylene Roes, CMA, am acting as scribe for Armida Sans, MD .  Documentation: I have reviewed the above documentation for accuracy and completeness, and I agree with the above.  Armida Sans, MD

## 2023-07-15 ENCOUNTER — Encounter: Payer: Self-pay | Admitting: Dermatology

## 2023-07-16 DIAGNOSIS — M25562 Pain in left knee: Secondary | ICD-10-CM | POA: Diagnosis not present

## 2023-07-16 DIAGNOSIS — R262 Difficulty in walking, not elsewhere classified: Secondary | ICD-10-CM | POA: Diagnosis not present

## 2023-07-16 DIAGNOSIS — M25561 Pain in right knee: Secondary | ICD-10-CM | POA: Diagnosis not present

## 2023-07-21 DIAGNOSIS — R262 Difficulty in walking, not elsewhere classified: Secondary | ICD-10-CM | POA: Diagnosis not present

## 2023-07-21 DIAGNOSIS — M25562 Pain in left knee: Secondary | ICD-10-CM | POA: Diagnosis not present

## 2023-07-21 DIAGNOSIS — M25561 Pain in right knee: Secondary | ICD-10-CM | POA: Diagnosis not present

## 2023-07-23 DIAGNOSIS — M25562 Pain in left knee: Secondary | ICD-10-CM | POA: Diagnosis not present

## 2023-07-23 DIAGNOSIS — M25561 Pain in right knee: Secondary | ICD-10-CM | POA: Diagnosis not present

## 2023-07-23 DIAGNOSIS — R262 Difficulty in walking, not elsewhere classified: Secondary | ICD-10-CM | POA: Diagnosis not present

## 2023-07-27 ENCOUNTER — Other Ambulatory Visit (INDEPENDENT_AMBULATORY_CARE_PROVIDER_SITE_OTHER): Payer: 59

## 2023-07-27 DIAGNOSIS — R7301 Impaired fasting glucose: Secondary | ICD-10-CM

## 2023-07-27 DIAGNOSIS — E559 Vitamin D deficiency, unspecified: Secondary | ICD-10-CM

## 2023-07-27 DIAGNOSIS — M25561 Pain in right knee: Secondary | ICD-10-CM | POA: Diagnosis not present

## 2023-07-27 DIAGNOSIS — E785 Hyperlipidemia, unspecified: Secondary | ICD-10-CM

## 2023-07-27 DIAGNOSIS — M25562 Pain in left knee: Secondary | ICD-10-CM | POA: Diagnosis not present

## 2023-07-27 DIAGNOSIS — R262 Difficulty in walking, not elsewhere classified: Secondary | ICD-10-CM | POA: Diagnosis not present

## 2023-07-27 LAB — COMPREHENSIVE METABOLIC PANEL
ALT: 17 U/L (ref 0–35)
AST: 16 U/L (ref 0–37)
Albumin: 4.5 g/dL (ref 3.5–5.2)
Alkaline Phosphatase: 69 U/L (ref 39–117)
BUN: 15 mg/dL (ref 6–23)
CO2: 27 meq/L (ref 19–32)
Calcium: 9.8 mg/dL (ref 8.4–10.5)
Chloride: 103 meq/L (ref 96–112)
Creatinine, Ser: 0.61 mg/dL (ref 0.40–1.20)
GFR: 98.04 mL/min (ref >60.00)
Glucose, Bld: 92 mg/dL (ref 70–99)
Potassium: 4.3 meq/L (ref 3.5–5.1)
Sodium: 139 meq/L (ref 135–145)
Total Bilirubin: 0.5 mg/dL (ref 0.2–1.2)
Total Protein: 7 g/dL (ref 6.0–8.3)

## 2023-07-27 LAB — VITAMIN D 25 HYDROXY (VIT D DEFICIENCY, FRACTURES): VITD: 25.62 ng/mL — ABNORMAL LOW (ref 30.00–100.00)

## 2023-07-27 LAB — LDL CHOLESTEROL, DIRECT: Direct LDL: 165 mg/dL

## 2023-07-27 LAB — LIPID PANEL
Cholesterol: 269 mg/dL — ABNORMAL HIGH (ref 0–200)
HDL: 61.9 mg/dL (ref >39.00)
LDL Cholesterol: 159 mg/dL — ABNORMAL HIGH (ref 0–99)
NonHDL: 206.71
Total CHOL/HDL Ratio: 4
Triglycerides: 240 mg/dL — ABNORMAL HIGH (ref 0.0–149.0)
VLDL: 48 mg/dL — ABNORMAL HIGH (ref 0.0–40.0)

## 2023-07-27 LAB — HEMOGLOBIN A1C: Hgb A1c MFr Bld: 5.6 % (ref 4.6–6.5)

## 2023-07-29 ENCOUNTER — Encounter: Payer: Self-pay | Admitting: Internal Medicine

## 2023-07-29 ENCOUNTER — Ambulatory Visit (INDEPENDENT_AMBULATORY_CARE_PROVIDER_SITE_OTHER): Payer: 59 | Admitting: Internal Medicine

## 2023-07-29 VITALS — BP 110/78 | HR 67 | Temp 98.2°F | Ht 67.0 in | Wt 173.2 lb

## 2023-07-29 DIAGNOSIS — Z0000 Encounter for general adult medical examination without abnormal findings: Secondary | ICD-10-CM | POA: Diagnosis not present

## 2023-07-29 DIAGNOSIS — M0579 Rheumatoid arthritis with rheumatoid factor of multiple sites without organ or systems involvement: Secondary | ICD-10-CM

## 2023-07-29 DIAGNOSIS — Z636 Dependent relative needing care at home: Secondary | ICD-10-CM | POA: Diagnosis not present

## 2023-07-29 DIAGNOSIS — E039 Hypothyroidism, unspecified: Secondary | ICD-10-CM | POA: Diagnosis not present

## 2023-07-29 DIAGNOSIS — Z1231 Encounter for screening mammogram for malignant neoplasm of breast: Secondary | ICD-10-CM

## 2023-07-29 DIAGNOSIS — E785 Hyperlipidemia, unspecified: Secondary | ICD-10-CM | POA: Diagnosis not present

## 2023-07-29 DIAGNOSIS — E559 Vitamin D deficiency, unspecified: Secondary | ICD-10-CM

## 2023-07-29 DIAGNOSIS — N950 Postmenopausal bleeding: Secondary | ICD-10-CM | POA: Diagnosis not present

## 2023-07-29 DIAGNOSIS — Z23 Encounter for immunization: Secondary | ICD-10-CM | POA: Diagnosis not present

## 2023-07-29 MED ORDER — FOLIC ACID 1 MG PO TABS: 2.0000 mg | ORAL_TABLET | Freq: Every day | ORAL | 2 refills | Status: DC

## 2023-07-29 MED ORDER — ERGOCALCIFEROL 1.25 MG (50000 UT) PO CAPS
50000.0000 [IU] | ORAL_CAPSULE | ORAL | 0 refills | Status: DC
Start: 2023-07-29 — End: 2023-08-04

## 2023-07-29 MED ORDER — SERTRALINE HCL 50 MG PO TABS
50.0000 mg | ORAL_TABLET | Freq: Every day | ORAL | 3 refills | Status: DC
Start: 2023-07-29 — End: 2023-11-10

## 2023-07-29 NOTE — Assessment & Plan Note (Addendum)
Discussed treating her anxiety ,  trial of sertraline .

## 2023-07-29 NOTE — Assessment & Plan Note (Signed)
She has no symptoms of under or overactive Thyroid on current levothyroxine dose of 100 mcg    Lab Results  Component Value Date   TSH 1.30 02/21/2022

## 2023-07-29 NOTE — Assessment & Plan Note (Signed)
Managed by Dr Allena Katz at [facility],  currently taking methotrexat 12.5 mg weekly   folic acid  daily and Humira 40 mg every 2 weeks

## 2023-07-29 NOTE — Patient Instructions (Addendum)
I am Prescribing  a weekly dose of Vitamin  D in a megadose .  Take it for 6 months .  We will recheck your level in 6 months , before you return   For your anxiety:   Please start the  Sertraline (generic for Zoloft) at 1/2 tablet daily in the morning with breakfast or lunch the first week to avoid nausea.  You can increase THE DOSE to a full tablet after 1 week if you have not developed side effects of nausea.    You should start to feel a difference after two weeks on the full dose .  If you are still very anixous,  increase dose to 100 mg daily and let me know how that works    Return in 6 months to consider cholesterol medication to prevent heart attacks

## 2023-07-29 NOTE — Assessment & Plan Note (Signed)
She was referred   last year for  endometrial evaluation   biopsy was reportedly normal.  .  Records requested from [facility]

## 2023-07-29 NOTE — Progress Notes (Signed)
Patient ID: Melissa Baker, female    DOB: [birth year]  Age: 57 y.o. MRN: [id number]  The patient is here for annual preventive examination and management of other chronic and acute problems.   The risk factors are reflected in the social history.   The roster of all physicians providing medical care to patient - is listed in the Snapshot section of the chart.   Activities of daily living:  The patient is 100% independent in all ADLs: dressing, toileting, feeding as well as independent mobility   Home safety : The patient has smoke detectors in the home. They wear seatbelts.  There are no unsecured firearms at home. There is no violence in the home.    There is no risks for hepatitis, STDs or HIV. There is no   history of blood transfusion. They have no travel history to infectious disease endemic areas of the world.   The patient has seen their dentist in the last six month. They have seen their eye doctor in the last year. The patinet  denies slight hearing difficulty with regard to whispered voices and some television programs.  They have deferred audiologic testing in the last year.  They do not  have excessive sun exposure. Discussed the need for sun protection: hats, long sleeves and use of sunscreen if there is significant sun exposure.    Diet: the importance of a healthy diet is discussed. They do have a healthy diet.   The benefits of regular aerobic exercise were discussed. The patient  exercises  3 to 5 days per week  for  60 minutes.    Depression screen: there are no signs or vegative symptoms of depression- irritability, change in appetite, anhedonia, sadness/tearfullness.   The following portions of the patient's history were reviewed and updated as appropriate: allergies, current medications, past family history, past medical history,  past surgical history, past social history  and problem list.   Visual acuity was not assessed per patient preference since the patient has  regular follow up with an  ophthalmologist. Hearing and body mass index were assessed and reviewed.    During the course of the visit the patient was educated and counseled about appropriate screening and preventive services including : fall prevention , diabetes screening, nutrition counseling, colorectal cancer screening, and recommended immunizations.    Chief Complaint:  1) anxiety /caregiver fatigue:  feels overwhelmed at times  caring for mother who is 62 and  widowed  and has become sedentary. Her mother emigrated from [place] 10 yrs ago with [place],  became sedentary due to grief after losing patient's sister  to [place] 2 yrs ago .  Patients daughter  is a Consulting civil engineer at [employer] but has spina bifida   . Patient wakes u p early  several days per week due to worrying     Review of Symptoms  Patient denies headache, fevers, malaise, unintentional weight loss, skin rash, eye pain, sinus congestion and sinus pain, sore throat, dysphagia,  hemoptysis , cough, dyspnea, wheezing, chest pain, palpitations, orthopnea, edema, abdominal pain, nausea, melena, diarrhea, constipation, flank pain, dysuria, hematuria, urinary  Frequency, nocturia, numbness, tingling, seizures,  Focal weakness, Loss of consciousness,  Tremor, depression, Physical Exam:   BP 110/78   Pulse 67   Temp 98.2 F (36.8 C) (Oral)   Ht 5\' 7"  (1.702 m)   Wt 173 lb 3.2 oz (78.6 kg)   SpO2 97%   BMI 27.13 kg/m    Physical Exam Vitals  reviewed.  Constitutional:      General: She is not in acute distress.    Appearance: Normal appearance. She is normal weight. She is not ill-appearing, toxic-appearing or diaphoretic.  HENT:     Head: Normocephalic.  Eyes:     General: No scleral icterus.       Right eye: No discharge.        Left eye: No discharge.     Conjunctiva/sclera: Conjunctivae normal.  Cardiovascular:     Rate and Rhythm: Normal rate and regular rhythm.     Heart sounds: Normal heart sounds.   Pulmonary:     Effort: Pulmonary effort is normal. No respiratory distress.     Breath sounds: Normal breath sounds.  Musculoskeletal:        General: Normal range of motion.  Skin:    General: Skin is warm and dry.  Neurological:     General: No focal deficit present.     Mental Status: She is alert and oriented to person, place, and time. Mental status is at baseline.  Psychiatric:        Mood and Affect: Mood normal.        Behavior: Behavior normal.        Thought Content: Thought content normal.        Judgment: Judgment normal.    Assessment and Plan: Encounter for screening mammogram for malignant neoplasm of breast -     3D Screening Mammogram, Left and Right; Future  Rheumatoid arthritis involving multiple sites with positive rheumatoid factor (HCC) Assessment & Plan: Managed by Dr Allena Katz at [facility],  currently taking methotrexat 12.5 mg weekly   folic acid  daily and Humira 40 mg every 2 weeks    Caregiver burden Assessment & Plan: Discussed treating her anxiety ,  trial of sertraline .    Hyperlipidemia LDL goal <130 -     Lipid panel; Future -     LDL cholesterol, direct -     Comprehensive metabolic panel; Future  Vitamin D deficiency -     VITAMIN D 25 Hydroxy (Vit-D Deficiency, Fractures); Future  Need for influenza vaccination -     Flu vaccine trivalent PF, 6mos and older(Flulaval,Afluria,Fluarix,Fluzone)  Encounter for preventive health examination Assessment & Plan: age appropriate education and counseling updated, referrals for preventative services and immunizations addressed, dietary and smoking counseling addressed, most recent labs reviewed.  I have personally reviewed and have noted:   1) the patient's medical and social history 2) The pt's use of alcohol, tobacco, and illicit drugs 3) The patient's current medications and supplements 4) Functional ability including ADL's, fall risk, home safety risk, hearing and visual impairment 5)  Diet and physical activities 6) Evidence for depression or mood disorder 7) The patient's height, weight, and BMI have been recorded in the chart   I have made referrals, and provided counseling and education based on review of the above    Acquired hypothyroidism Assessment & Plan:  She has no symptoms of under or overactive Thyroid on current levothyroxine dose of 100 mcg    Lab Results  Component Value Date   TSH 1.30 02/21/2022     Orders: -     TSH; Future  Post-menopausal bleeding Assessment & Plan: She was referred   last year for  endometrial evaluation   biopsy was reportedly normal.  .  Records requested from [facility]    Other orders -     Folic Acid; Take 2 tablets (  2 mg total) by mouth daily.  Dispense: 180 tablet; Refill: 2 -     Ergocalciferol; Take 1 capsule (50,000 Units total) by mouth once a week.  Dispense: 12 capsule; Refill: 0 -     Sertraline HCl; Take 1 tablet (50 mg total) by mouth daily.  Dispense: 90 tablet; Refill: 3    Return in about 6 months (around 01/27/2024).  Sherlene Shams, MD

## 2023-07-29 NOTE — Assessment & Plan Note (Signed)

## 2023-07-30 ENCOUNTER — Encounter: Payer: Self-pay | Admitting: Internal Medicine

## 2023-08-04 ENCOUNTER — Encounter: Payer: Self-pay | Admitting: Internal Medicine

## 2023-08-04 DIAGNOSIS — E039 Hypothyroidism, unspecified: Secondary | ICD-10-CM

## 2023-08-04 MED ORDER — ERGOCALCIFEROL 1.25 MG (50000 UT) PO CAPS: 50000.0000 [IU] | ORAL_CAPSULE | ORAL | 0 refills | Status: DC

## 2023-08-05 MED ORDER — LEVOTHYROXINE SODIUM 100 MCG PO TABS
ORAL_TABLET | ORAL | 1 refills | Status: DC
Start: 2023-08-05 — End: 2024-01-27

## 2023-08-05 NOTE — Telephone Encounter (Signed)
Gave permission to [facility] to change the manufacturer of pt's Levothyroxine. Would you like for her to schedule a lab appt in 6 weeks to have TSH checked?

## 2023-09-03 ENCOUNTER — Telehealth: Payer: Self-pay

## 2023-09-03 DIAGNOSIS — E039 Hypothyroidism, unspecified: Secondary | ICD-10-CM

## 2023-09-03 NOTE — Telephone Encounter (Signed)
Received a fax from pharmacy for request to change manufacturer of levothyroxine. Verbal was given.   LMTCB. Need to let pt know that since the manufacturer of her levothyroxine has changed we will need to check her TSH level in 6 weeks. Please schedule pt for non fasting lab in 6 weeks. Lab order has been placed.

## 2023-09-04 NOTE — Telephone Encounter (Signed)
Patient returned office phone call, note was read and lab was scheduled.

## 2023-10-01 ENCOUNTER — Encounter: Payer: Self-pay | Admitting: Internal Medicine

## 2023-10-19 ENCOUNTER — Other Ambulatory Visit (INDEPENDENT_AMBULATORY_CARE_PROVIDER_SITE_OTHER): Payer: 59

## 2023-10-19 DIAGNOSIS — E559 Vitamin D deficiency, unspecified: Secondary | ICD-10-CM | POA: Diagnosis not present

## 2023-10-19 DIAGNOSIS — E039 Hypothyroidism, unspecified: Secondary | ICD-10-CM | POA: Diagnosis not present

## 2023-10-19 DIAGNOSIS — E785 Hyperlipidemia, unspecified: Secondary | ICD-10-CM

## 2023-10-19 LAB — COMPREHENSIVE METABOLIC PANEL
ALT: 16 U/L (ref 0–35)
AST: 18 U/L (ref 0–37)
Albumin: 4.7 g/dL (ref 3.5–5.2)
Alkaline Phosphatase: 68 U/L (ref 39–117)
BUN: 14 mg/dL (ref 6–23)
CO2: 28 meq/L (ref 19–32)
Calcium: 9.7 mg/dL (ref 8.4–10.5)
Chloride: 102 meq/L (ref 96–112)
Creatinine, Ser: 0.65 mg/dL (ref 0.40–1.20)
GFR: 96.39 mL/min (ref >60.00)
Glucose, Bld: 91 mg/dL (ref 70–99)
Potassium: 4.1 meq/L (ref 3.5–5.1)
Sodium: 140 meq/L (ref 135–145)
Total Bilirubin: 0.5 mg/dL (ref 0.2–1.2)
Total Protein: 7.2 g/dL (ref 6.0–8.3)

## 2023-10-19 LAB — LIPID PANEL
Cholesterol: 264 mg/dL — ABNORMAL HIGH (ref 0–200)
HDL: 63.6 mg/dL (ref >39.00)
LDL Cholesterol: 172 mg/dL — ABNORMAL HIGH (ref 0–99)
NonHDL: 200.16
Total CHOL/HDL Ratio: 4
Triglycerides: 141 mg/dL (ref 0.0–149.0)
VLDL: 28.2 mg/dL (ref 0.0–40.0)

## 2023-10-19 LAB — VITAMIN D 25 HYDROXY (VIT D DEFICIENCY, FRACTURES): VITD: 34.06 ng/mL (ref 30.00–100.00)

## 2023-10-19 LAB — TSH: TSH: 1.54 u[IU]/mL (ref 0.35–5.50)

## 2023-10-27 ENCOUNTER — Ambulatory Visit
Admission: RE | Admit: 2023-10-27 | Discharge: 2023-10-27 | Disposition: A | Discharge status: Home / Self Care | Payer: 59 | Source: Ambulatory Visit | Attending: Emergency Medicine | Admitting: Emergency Medicine

## 2023-10-27 VITALS — BP 139/82 | HR 82 | Temp 99.2°F | Resp 18

## 2023-10-27 DIAGNOSIS — J069 Acute upper respiratory infection, unspecified: Secondary | ICD-10-CM

## 2023-10-27 LAB — POC COVID19/FLU A&B COMBO
Covid Antigen, POC: NEGATIVE
Influenza A Antigen, POC: NEGATIVE
Influenza B Antigen, POC: NEGATIVE

## 2023-10-27 NOTE — ED Triage Notes (Signed)
 Patient to Urgent Care with complaints of low energy/ nasal congestion and chest congestion. Possible fevers.   Reports "recent exposure to Norovirus".  Symptoms started yesterday. Recent travel.  Taking tylenol. Poor sleep.

## 2023-10-27 NOTE — Discharge Instructions (Addendum)
 The COVID and flu tests are negative.   Take Tylenol or ibuprofen as needed for fever or discomfort.  Take plain Mucinex as needed for congestion.  Rest and keep yourself hydrated.    Follow-up with your primary care provider if your symptoms are not improving.

## 2023-10-27 NOTE — ED Provider Notes (Signed)
 [name]    CSN: [id number] Arrival date & time: 10/27/23  1515      History   Chief Complaint Chief Complaint  Patient presents with   Cough    Sore throat, dizziness, headache. Recent exposure to norovirus. - Entered by patient    HPI Melissa Baker is a 58 y.o. female.  Patient presents with 1 day history of fatigue, runny nose, congestion, cough.  She reports possible subjective fever.  She took Tylenol this morning but none since.  No shortness of breath, chest pain, vomiting, diarrhea.  Patient recently traveled to [facility] for family visit.  The history is provided by the patient and medical records.    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   Myalgia 06/07/2021   Encounter for preventive health examination 07/13/2019   Encounter for Papanicolaou smear for cervical cancer screening 07/13/2019   Encounter for screening mammogram for breast cancer 07/13/2019   Hyperlipidemia LDL goal <130 07/13/2019   Carpal tunnel syndrome on both sides 07/13/2019   Rheumatoid arthritis (HCC) 07/13/2019   Chronic pain of left wrist 04/11/2019   Hypothyroidism 09/26/2018   Bilateral lower extremity edema 06/23/2018   Varicose veins of both lower extremities with pain 06/23/2018   Acute pain of left knee 09/19/2017    Class: Acute    Past Surgical History:  Procedure Laterality Date   NO PAST SURGERIES      OB History   No obstetric history on file.      Home Medications    Prior to Admission medications   Medication Sig Start Date End Date Taking? Authorizing Provider  Biotin 1000 MCG tablet Take 1,000 mcg by mouth daily.    [provider]  calcium carbonate (OS-CAL) 600 MG TABS  tablet Take 600 mg by mouth daily with breakfast.    [provider]  cholecalciferol (VITAMIN D3) 25 MCG (1000 UNIT) tablet Take 2,000 Units by mouth daily.    [provider]  ergocalciferol  (DRISDOL ) 1.25 MG (50000 UT) capsule Take 1 capsule (50,000 Units total) by mouth once a week. 08/04/23   Marylynn Verneita CROME, MD  folic acid  (FOLVITE ) 1 MG tablet Take 2 tablets (2 mg total) by mouth daily. 07/29/23   Tullo, Teresa L, MD  HUMIRA PEN 40 MG/0.4ML PNKT SMARTSIG:40 Milligram(s) SUB-Q Every 2 Weeks 06/26/20   [provider]  levothyroxine  (SYNTHROID ) 100 MCG tablet TAKE 1 TABLET BY MOUTH ONCE DAILY IN THE MORNING BEFORE BREAKFAST 08/05/23   Tullo, Teresa L, MD  methotrexate (RHEUMATREX) 2.5 MG tablet Take 5 tablets by mouth once a week. 04/23/18   [provider]  Omega-3 Fatty Acids (OMEGA-3 FISH OIL) 500 MG CAPS Take 1 capsule by mouth daily.    [provider]  sertraline  (ZOLOFT ) 50 MG tablet Take 1 tablet (50 mg total) by mouth daily. 07/29/23   Marylynn Verneita CROME, MD  tretinoin  (RETIN-A ) 0.025 % cream Apply a pea sized amount to the entire face QHS. 07/14/23   Hester Alm BROCKS, MD    Family History Family History  Problem Relation Age of Onset   Varicose Veins Mother    Arthritis Mother    Heart failure Father     Social History Social  History   Tobacco Use   Smoking status: Never   Smokeless tobacco: Never  Substance Use Topics   Alcohol use: Never   Drug use: Never     Allergies   Patient has no known allergies.   Review of Systems Review of Systems  Constitutional:  Positive for fatigue. Negative for chills.  HENT:  Positive for congestion and rhinorrhea. Negative for ear pain and sore throat.   Respiratory:  Positive for cough. Negative for shortness of breath.   Cardiovascular:  Negative for chest pain and palpitations.  Gastrointestinal:  Negative for diarrhea and vomiting.     Physical Exam Triage Vital Signs ED Triage  Vitals  Encounter Vitals Group     BP 10/27/23 1534 139/82     Systolic BP Percentile --      Diastolic BP Percentile --      Pulse Rate 10/27/23 1530 82     Resp 10/27/23 1530 18     Temp 10/27/23 1530 99.2 F (37.3 C)     Temp src --      SpO2 10/27/23 1530 96 %     Weight --      Height --      Head Circumference --      Peak Flow --      Pain Score 10/27/23 1531 5     Pain Loc --      Pain Education --      Exclude from Growth Chart --    No data found.  Updated Vital Signs BP 139/82   Pulse 82   Temp 99.2 F (37.3 C)   Resp 18   SpO2 96%   Visual Acuity Right Eye Distance:   Left Eye Distance:   Bilateral Distance:    Right Eye Near:   Left Eye Near:    Bilateral Near:     Physical Exam Constitutional:      General: She is not in acute distress. HENT:     Right Ear: Tympanic membrane normal.     Left Ear: Tympanic membrane normal.     Nose: Rhinorrhea present.     Mouth/Throat:     Mouth: Mucous membranes are moist.     Pharynx: Oropharynx is clear.  Cardiovascular:     Rate and Rhythm: Normal rate and regular rhythm.     Heart sounds: Normal heart sounds.  Pulmonary:     Effort: Pulmonary effort is normal. No respiratory distress.     Breath sounds: Normal breath sounds.  Neurological:     Mental Status: She is alert.      UC Treatments / Results  Labs (all labs ordered are listed, but only abnormal results are displayed) Labs Reviewed  POC COVID19/FLU A&B COMBO    EKG   Radiology No results found.  Procedures Procedures (including critical care time)  Medications Ordered in UC Medications - No data to display  Initial Impression / Assessment and Plan / UC Course  I have reviewed the triage vital signs and the nursing notes.  Pertinent labs & imaging results that were available during my care of the patient were reviewed by me and considered in my medical decision making (see chart for details).    Viral URI.  Rapid COVID and  flu negative.  Discussed symptomatic treatment including Tylenol or ibuprofen as needed for fever or discomfort, plain Mucinex  as needed for congestion, rest, hydration.  Instructed patient to follow-up with PCP if not improving.  ED precautions given.  Patient agrees to plan of care.   Final Clinical Impressions(s) / UC Diagnoses   Final diagnoses:  Viral URI     Discharge Instructions      The COVID and flu tests are negative.   Take Tylenol or ibuprofen as needed for fever or discomfort.  Take plain Mucinex  as needed for congestion.  Rest and keep yourself hydrated.    Follow-up with your primary care provider if your symptoms are not improving.         ED Prescriptions   None    PDMP not reviewed this encounter.   Corlis Burnard DEL, NP 10/27/23 1558

## 2023-11-07 ENCOUNTER — Other Ambulatory Visit: Payer: Self-pay | Admitting: Internal Medicine

## 2023-11-10 ENCOUNTER — Encounter: Payer: Self-pay | Admitting: Family Medicine

## 2023-11-10 ENCOUNTER — Ambulatory Visit (INDEPENDENT_AMBULATORY_CARE_PROVIDER_SITE_OTHER): Payer: 59 | Admitting: Family Medicine

## 2023-11-10 VITALS — BP 130/82 | HR 67 | Temp 98.2°F | Ht 67.0 in | Wt 170.4 lb

## 2023-11-10 DIAGNOSIS — J069 Acute upper respiratory infection, unspecified: Secondary | ICD-10-CM | POA: Diagnosis not present

## 2023-11-10 MED ORDER — GUAIFENESIN-CODEINE 100-10 MG/5ML PO SOLN
10.0000 mL | Freq: Three times a day (TID) | ORAL | 0 refills | Status: DC | PRN
Start: 2023-11-10 — End: 2024-07-28

## 2023-11-10 NOTE — Progress Notes (Signed)
Marikay Alar, MD Phone: [phone]  Melissa Baker is a 58 y.o. female who presents today for same day visit.   Cough: Patient with onset of symptoms around 10/28/2023.  Notes she had some congestion and fever initially though those have resolved.  She still has some cough.  Some days are better than others.  She notes progressive improvement.  She notes no shortness of breath.  Does report some postnasal drip.  No rhinorrhea.  No sneezing.  She is been taking over-the-counter cough medicine with Robitussin.  Notes some right ear discomfort that is worse when she coughs.  She reports negative COVID and flu testing.  Social History   Tobacco Use  Smoking Status Never  Smokeless Tobacco Never    Current Outpatient Medications on File Prior to Visit  Medication Sig Dispense Refill   Biotin 1000 MCG tablet Take 1,000 mcg by mouth daily.     calcium carbonate (OS-CAL) 600 MG TABS tablet Take 600 mg by mouth daily with breakfast.     cholecalciferol (VITAMIN D3) 25 MCG (1000 UNIT) tablet Take 2,000 Units by mouth daily.     folic acid (FOLVITE) 1 MG tablet Take 2 tablets (2 mg total) by mouth daily. 180 tablet 2   HUMIRA PEN 40 MG/0.4ML PNKT SMARTSIG:40 Milligram(s) SUB-Q Every 2 Weeks     levothyroxine (SYNTHROID) 100 MCG tablet TAKE 1 TABLET BY MOUTH ONCE DAILY IN THE MORNING BEFORE BREAKFAST 90 tablet 1   methotrexate (RHEUMATREX) 2.5 MG tablet Take 5 tablets by mouth once a week.     Omega-3 Fatty Acids (OMEGA-3 FISH OIL) 500 MG CAPS Take 1 capsule by mouth daily.     tretinoin (RETIN-A) 0.025 % cream Apply a pea sized amount to the entire face QHS. 45 g 11   Vitamin D, Ergocalciferol, (DRISDOL) 1.25 MG (50000 UNIT) CAPS capsule Take 1 capsule by mouth once a week 12 capsule 0   No current facility-administered medications on file prior to visit.     ROS see history of present illness  Objective  Physical Exam Vitals:   11/10/23 0812  BP: 130/82  Pulse: 67  Temp: 98.2 F  (36.8 C)  SpO2: 99%    BP Readings from Last 3 Encounters:  11/10/23 130/82  10/27/23 139/82  07/29/23 110/78   Wt Readings from Last 3 Encounters:  11/10/23 170 lb 6.4 oz (77.3 kg)  07/29/23 173 lb 3.2 oz (78.6 kg)  01/21/23 169 lb (76.7 kg)    Physical Exam Constitutional:      General: She is not in acute distress.    Appearance: She is not diaphoretic.  HENT:     Ears:     Comments: Right TM obscured by cerumen, left TM partially obscured by cerumen, the visualized portion of the left TM appears normal    Mouth/Throat:     Mouth: Mucous membranes are moist.     Pharynx: Posterior oropharyngeal erythema present. No oropharyngeal exudate.     Comments: Postnasal drip noted Cardiovascular:     Rate and Rhythm: Normal rate and regular rhythm.     Heart sounds: Normal heart sounds.  Pulmonary:     Effort: Pulmonary effort is normal.     Breath sounds: Normal breath sounds.  Lymphadenopathy:     Cervical: No cervical adenopathy.  Skin:    General: Skin is warm and dry.  Neurological:     Mental Status: She is alert.    CMA irrigated right ear, there was still some cerumen  noted though I was able to visualize the upper half of her tympanic membrane and there was no sign of purulent infection.  Assessment/Plan: Please see individual problem list.  Viral upper respiratory illness Assessment & Plan: Suspect the patient's symptoms are related to a virus.  She has been improving some.  There is nothing on her exam to indicate focal bacterial illness.  Discussed it could take up to a month for cough to resolve after having a virus.  Will provide codeine cough syrup as outlined.  She can start over-the-counter second-generation antihistamine such as Allegra or Zyrtec.  If not improving over the next 2 weeks or if she has worsening symptoms she will let us know.  She will contact us if she develops worsening ear pain.  Suspect her ear discomfort is related to eustachian tube  dysfunction.  Discussed this should improve with second-generation antihistamine.  Orders: -     guaiFENesin-Codeine; Take 10 mLs by mouth 3 (three) times daily as needed for cough.  Dispense: 120 mL; Refill: 0     Return if symptoms worsen or fail to improve.   Marikay Alar, MD [facility]

## 2023-11-10 NOTE — Assessment & Plan Note (Addendum)
Suspect the patient's symptoms are related to a virus.  She has been improving some.  There is nothing on her exam to indicate focal bacterial illness.  Discussed it could take up to a month for cough to resolve after having a virus.  Will provide codeine cough syrup as outlined.  She can start over-the-counter second-generation antihistamine such as Allegra or Zyrtec.  If not improving over the next 2 weeks or if she has worsening symptoms she will let us know.  She will contact us if she develops worsening ear pain.  Suspect her ear discomfort is related to eustachian tube dysfunction.  Discussed this should improve with second-generation antihistamine.

## 2023-11-10 NOTE — Patient Instructions (Signed)
Nice to see you. You can start Allegra or Zyrtec over-the-counter and see if that helps with some of your symptoms. You can take the codeine cough syrup up to 3 times daily as needed for cough.  Please do not drive while taking this.  If it makes you drowsy please discontinue use of it. If your symptoms are not continuing to improve over the next 2 weeks please let us know.

## 2023-11-12 ENCOUNTER — Encounter: Payer: Self-pay | Admitting: Internal Medicine

## 2023-11-13 MED ORDER — AZITHROMYCIN 250 MG PO TABS: ORAL_TABLET | ORAL | 0 refills | Status: AC

## 2023-11-13 NOTE — Telephone Encounter (Signed)
I have sent in the azithromycin for her to try. She should be reevaluated if her shortness of breath is persisting or recurring.

## 2023-12-01 ENCOUNTER — Ambulatory Visit: Payer: 59 | Admitting: Internal Medicine

## 2023-12-08 ENCOUNTER — Encounter: Payer: Self-pay | Admitting: Dermatology

## 2023-12-08 ENCOUNTER — Ambulatory Visit: Payer: 59 | Admitting: Dermatology

## 2023-12-08 DIAGNOSIS — L811 Chloasma: Secondary | ICD-10-CM | POA: Diagnosis not present

## 2023-12-08 DIAGNOSIS — L988 Other specified disorders of the skin and subcutaneous tissue: Secondary | ICD-10-CM

## 2023-12-08 DIAGNOSIS — Z79899 Other long term (current) drug therapy: Secondary | ICD-10-CM

## 2023-12-08 DIAGNOSIS — Z7189 Other specified counseling: Secondary | ICD-10-CM | POA: Diagnosis not present

## 2023-12-08 MED ORDER — TRETINOIN 0.025 % EX CREA: TOPICAL_CREAM | Freq: Every day | CUTANEOUS | 6 refills | Status: AC

## 2023-12-08 NOTE — Progress Notes (Signed)
Follow-Up Visit   Subjective  Melissa Baker is a 58 y.o. female who presents for the following: Botox for facial elastosis and discuss dark spots on her face.   The following portions of the chart were reviewed this encounter and updated as appropriate: medications, allergies, medical history  Review of Systems:  No other skin or systemic complaints except as noted in HPI or Assessment and Plan.  Objective  Well appearing patient in no apparent distress; mood and affect are within normal limits.  A focused examination was performed of the face.  Relevant physical exam findings are noted in the Assessment and Plan.            Assessment & Plan    Facial Elastosis  Location: See attached image  Informed consent: Discussed risks (infection, pain, bleeding, bruising, swelling, allergic reaction, paralysis of nearby muscles, eyelid droop, double vision, neck weakness, difficulty breathing, headache, undesirable cosmetic result, and need for additional treatment) and benefits of the procedure, as well as the alternatives.  Informed consent was obtained.  Preparation: The area was cleansed with alcohol.  Procedure Details:  Botox was injected into the dermis with a 30-gauge needle. Pressure applied to any bleeding. Ice packs offered for swelling.  Lot Number:  [id number] Expiration:  06/2025  Total Units Injected:  20  Plan: Tylenol may be used for headache.  Allow 2 weeks before returning to clinic for additional dosing as needed. Patient will call for any problems.   MELASMA Exam: reticulated hyperpigmented patches at face  Chronic and persistent condition with duration or expected duration over one year. Condition is improving with treatment but not currently at goal.     Melasma is a chronic; persistent condition of hyperpigmented patches generally on the face, worse in summer due to higher UV exposure.    Heredity; thyroid disease; sun exposure; pregnancy;  birth control pills; epilepsy medication and darker skin may predispose to Melasma.   Recommendations include: - Sun avoidance and daily broad spectrum (UVA/UVB) tinted mineral sunscreen SPF 30+, with Zinc or Titanium Dioxide. - Rx topical bleaching creams (i.e. hydroquinone) is a common treatment but should not be used long term.  Hydroquinones may be mixed with retinoids; vitamin C; steroids; Kojic Acid. - Alastin A-luminate, retinoids, vitamin C, topical tranexamic acid, glycolic acid and kojic acid can be used for brightening while on break from hydroquinone - Rx Azelaic Acid is also a treatment option that is safe for pregnancy (Category B). - OTC Heliocare can be helpful in control and prevention. - Oral Rx with Tranexamic Acid 250 mg - 650 mg po daily can be used for moderate to severe cases especially during summer (contraindications include pregnancy; lactation; hx of PE; hx of DVT; clotting disorder; heart disease; anticoagulant use and upcoming long trips)   - Chemical peels (would need multiple for best result).  - Lasers and  Microdermabrasion may also be helpful adjunct treatments.   Treatment Plan: Cont SM19 Hydroquinone 12% / Kojic Acid 6% / Niacinamide 2% / Vitamin C 1% Cream QD x 3 months then stop for 3 months.  Cont Retin A 0.025% cream apply to face at bedtime    Reviewed risk of permanent dark spots (ochronosis) if hydroquinone is used longer than 3 months.   Return in about 5 months (around 05/06/2024) for Botox .  Cipriano Bunker, CMA, am acting as scribe for Armida Sans, MD .   Documentation: I have reviewed the above documentation for accuracy and completeness, and I  agree with the above.  Armida Sans, MD

## 2023-12-08 NOTE — Patient Instructions (Addendum)
 Instructions for [facility] Medications  One or more of your medications was sent to the [facility] mail order compounding pharmacy. You will receive an email from them and can purchase the medicine through that link. It will then be mailed to your home at the address you confirmed. If for any reason you do not receive an email from them, please check your spam folder. If you still do not find the email, please let us know. [facility] phone number is [phone].       Due to recent changes in healthcare laws, you may see results of your pathology and/or laboratory studies on MyChart before the doctors have had a chance to review them. We understand that in some cases there may be results that are confusing or concerning to you. Please understand that not all results are received at the same time and often the doctors may need to interpret multiple results in order to provide you with the best plan of care or course of treatment. Therefore, we ask that you please give [place] 2 business days to thoroughly review all your results before contacting the office for clarification. Should we see a critical lab result, you will be contacted sooner.   If You Need Anything After Your Visit  If you have any questions or concerns for your doctor, please call our main line at [phone] and press option 4 to reach your doctor's medical assistant. If no one answers, please leave a voicemail as directed and we will return your call as soon as possible. Messages left after 4 pm will be answered the following business day.   You may also send [place] a message via MyChart. We typically respond to MyChart messages within 1-2 business days.  For prescription refills, please ask your pharmacy to contact our office. Our fax number is [phone].  If you have an urgent issue when the clinic is closed that cannot wait until the next business day, you can page your doctor at the number below.    Please note  that while we do our best to be available for urgent issues outside of office hours, we are not available 24/7.   If you have an urgent issue and are unable to reach [place], you may choose to seek medical care at your doctor's office, retail clinic, urgent care center, or emergency room.  If you have a medical emergency, please immediately call 911 or go to the emergency department.  Pager Numbers  - Dr. Gwen Pounds: [phone]  - Dr. Roseanne Reno: [phone]  - Dr. Katrinka Blazing: [phone]   In the event of inclement weather, please call our main line at [phone] for an update on the status of any delays or closures.  Dermatology Medication Tips: Please keep the boxes that topical medications come in in order to help keep track of the instructions about where and how to use these. Pharmacies typically print the medication instructions only on the boxes and not directly on the medication tubes.   If your medication is too expensive, please contact our office at [phone] option 4 or send [place] a message through MyChart.   We are unable to tell what your co-pay for medications will be in advance as this is different depending on your insurance coverage. However, we may be able to find a substitute medication at lower cost or fill out paperwork to get insurance to cover a needed medication.   If a prior authorization is required to get your medication covered by your  insurance company, please allow [place] 1-2 business days to complete this process.  Drug prices often vary depending on where the prescription is filled and some pharmacies may offer cheaper prices.  The website www.goodrx.com contains coupons for medications through different pharmacies. The prices here do not account for what the cost may be with help from insurance (it may be cheaper with your insurance), but the website can give you the price if you did not use any insurance.  - You can print the associated coupon and take it with your  prescription to the pharmacy.  - You may also stop by our office during regular business hours and pick up a GoodRx coupon card.  - If you need your prescription sent electronically to a different pharmacy, notify our office through [facility] or by phone at [phone] option 4.     Si Usted Necesita Algo Despus de Su Visita  Tambin puede enviarnos un mensaje a travs de Clinical cytogeneticist. Por lo general respondemos a los mensajes de MyChart en el transcurso de 1 a 2 das hbiles.  Para renovar recetas, por favor pida a su farmacia que se ponga en contacto con nuestra oficina. Annie Sable de fax es [place] [phone].  Si tiene un asunto urgente cuando la clnica est cerrada y que no puede esperar hasta el siguiente da hbil, puede llamar/localizar a su doctor(a) al nmero que aparece a continuacin.   Por favor, tenga en cuenta que aunque hacemos todo lo posible para estar disponibles para asuntos urgentes fuera del horario de [place], no estamos disponibles las 24 horas del da, los 7 [address]  [address] de la [place].   Si tiene un problema urgente y no puede comunicarse con nosotros, puede optar por buscar atencin mdica  en el consultorio de su doctor(a), en una clnica privada, en un centro de atencin urgente o en una sala de emergencias.  Si tiene Engineer, drilling, por favor llame inmediatamente al 911 o vaya a la sala de emergencias.  Nmeros de bper  - Dr. Gwen Pounds: [phone]  - Dra. Roseanne Reno: [phone]  - Dr. Katrinka Blazing: [phone]   En caso de inclemencias del tiempo, por favor llame a Lacy Duverney principal al [phone] para una actualizacin sobre el [place] de cualquier retraso o cierre.  Consejos para la medicacin en dermatologa: Por favor, guarde las cajas en las que vienen los medicamentos de uso tpico para ayudarle a seguir las instrucciones sobre dnde y cmo usarlos. Las farmacias generalmente imprimen las instrucciones del medicamento slo en las cajas y no  directamente en los tubos del [place].   Si su medicamento es muy caro, por favor, pngase en contacto con Rolm Gala llamando al [phone] y presione la opcin 4 o envenos un mensaje a travs de Clinical cytogeneticist.   No podemos decirle cul ser su copago por los medicamentos por adelantado ya que esto es diferente dependiendo de la cobertura de su seguro. Sin embargo, es posible que podamos encontrar un medicamento sustituto a Audiological scientist un formulario para que el seguro cubra el medicamento que se considera necesario.   Si se requiere una autorizacin previa para que su compaa de seguros [place] su medicamento, por favor permtanos de 1 a 2 das hbiles para completar [address]  Los precios de los medicamentos varan con frecuencia dependiendo del Environmental consultant de dnde se surte la receta y alguna farmacias pueden ofrecer precios ms baratos.  El sitio web www.goodrx.com tiene cupones para medicamentos de Health and safety inspector. Los precios aqu no tienen  en cuenta lo que podra costar con la ayuda del seguro (puede ser ms barato con su seguro), pero el sitio web puede darle el precio si no Visual merchandiser.  - Puede imprimir el cupn correspondiente y llevarlo con su receta a la farmacia.  - Tambin puede pasar por nuestra oficina durante el horario de atencin regular y Education officer, museum una tarjeta de cupones de GoodRx.  - Si necesita que su receta se enve electrnicamente a una farmacia diferente, informe a nuestra oficina a travs de MyChart de [place] o por telfono llamando al [phone] y presione la opcin 4.

## 2023-12-15 DIAGNOSIS — M170 Bilateral primary osteoarthritis of knee: Secondary | ICD-10-CM | POA: Diagnosis not present

## 2023-12-24 DIAGNOSIS — M0579 Rheumatoid arthritis with rheumatoid factor of multiple sites without organ or systems involvement: Secondary | ICD-10-CM | POA: Diagnosis not present

## 2023-12-24 DIAGNOSIS — M19071 Primary osteoarthritis, right ankle and foot: Secondary | ICD-10-CM | POA: Diagnosis not present

## 2023-12-25 DIAGNOSIS — M19071 Primary osteoarthritis, right ankle and foot: Secondary | ICD-10-CM | POA: Diagnosis not present

## 2023-12-25 DIAGNOSIS — M0579 Rheumatoid arthritis with rheumatoid factor of multiple sites without organ or systems involvement: Secondary | ICD-10-CM | POA: Diagnosis not present

## 2024-01-06 DIAGNOSIS — M0579 Rheumatoid arthritis with rheumatoid factor of multiple sites without organ or systems involvement: Secondary | ICD-10-CM | POA: Diagnosis not present

## 2024-01-06 DIAGNOSIS — Z7960 Long term (current) use of unspecified immunomodulators and immunosuppressants: Secondary | ICD-10-CM | POA: Diagnosis not present

## 2024-01-06 DIAGNOSIS — M8588 Other specified disorders of bone density and structure, other site: Secondary | ICD-10-CM | POA: Diagnosis not present

## 2024-01-06 DIAGNOSIS — M170 Bilateral primary osteoarthritis of knee: Secondary | ICD-10-CM | POA: Diagnosis not present

## 2024-01-13 DIAGNOSIS — M8588 Other specified disorders of bone density and structure, other site: Secondary | ICD-10-CM | POA: Diagnosis not present

## 2024-01-18 ENCOUNTER — Other Ambulatory Visit: Payer: Self-pay | Admitting: Internal Medicine

## 2024-01-18 DIAGNOSIS — J209 Acute bronchitis, unspecified: Secondary | ICD-10-CM

## 2024-01-25 ENCOUNTER — Other Ambulatory Visit: Payer: Self-pay | Admitting: Internal Medicine

## 2024-01-27 ENCOUNTER — Encounter: Payer: Self-pay | Admitting: Internal Medicine

## 2024-01-27 ENCOUNTER — Telehealth: Payer: Self-pay | Admitting: Internal Medicine

## 2024-01-27 ENCOUNTER — Ambulatory Visit (INDEPENDENT_AMBULATORY_CARE_PROVIDER_SITE_OTHER): Payer: 59 | Admitting: Internal Medicine

## 2024-01-27 ENCOUNTER — Other Ambulatory Visit: Payer: 59

## 2024-01-27 VITALS — BP 110/78 | HR 85 | Temp 98.5°F | Ht 67.0 in | Wt 170.2 lb

## 2024-01-27 DIAGNOSIS — E039 Hypothyroidism, unspecified: Secondary | ICD-10-CM

## 2024-01-27 DIAGNOSIS — J9801 Acute bronchospasm: Secondary | ICD-10-CM

## 2024-01-27 DIAGNOSIS — Z636 Dependent relative needing care at home: Secondary | ICD-10-CM

## 2024-01-27 DIAGNOSIS — E559 Vitamin D deficiency, unspecified: Secondary | ICD-10-CM | POA: Diagnosis not present

## 2024-01-27 DIAGNOSIS — R051 Acute cough: Secondary | ICD-10-CM | POA: Diagnosis not present

## 2024-01-27 DIAGNOSIS — J069 Acute upper respiratory infection, unspecified: Secondary | ICD-10-CM | POA: Diagnosis not present

## 2024-01-27 DIAGNOSIS — J209 Acute bronchitis, unspecified: Secondary | ICD-10-CM

## 2024-01-27 DIAGNOSIS — E785 Hyperlipidemia, unspecified: Secondary | ICD-10-CM

## 2024-01-27 DIAGNOSIS — J22 Unspecified acute lower respiratory infection: Secondary | ICD-10-CM

## 2024-01-27 LAB — POC COVID19 BINAXNOW: SARS Coronavirus 2 Ag: NEGATIVE

## 2024-01-27 LAB — POCT INFLUENZA A/B
Influenza A, POC: NEGATIVE
Influenza B, POC: NEGATIVE

## 2024-01-27 LAB — VITAMIN D 25 HYDROXY (VIT D DEFICIENCY, FRACTURES): VITD: 31.68 ng/mL (ref 30.00–100.00)

## 2024-01-27 MED ORDER — LEVOTHYROXINE SODIUM 100 MCG PO TABS: ORAL_TABLET | ORAL | 1 refills | Status: DC

## 2024-01-27 MED ORDER — PREDNISONE 10 MG PO TABS: ORAL_TABLET | ORAL | 0 refills | Status: DC

## 2024-01-27 MED ORDER — AMOXICILLIN-POT CLAVULANATE 875-125 MG PO TABS
1.0000 | ORAL_TABLET | Freq: Two times a day (BID) | ORAL | 0 refills | Status: DC
Start: 2024-01-27 — End: 2024-07-28

## 2024-01-27 MED ORDER — BENZONATATE 100 MG PO CAPS: 100.0000 mg | ORAL_CAPSULE | Freq: Three times a day (TID) | ORAL | 1 refills | Status: DC | PRN

## 2024-01-27 MED ORDER — FLUTICASONE PROPIONATE 50 MCG/ACT NA SUSP: 2.0000 | Freq: Every day | NASAL | 6 refills | Status: DC

## 2024-01-27 NOTE — Patient Instructions (Addendum)
 Your current symptoms may be  the common cold or season allergies.     consider adding one of the newer second generation antihistamines that are longer acting, non sedating and  available OTC:  Generic  Zyrtec, which is cetirizine.    generic Allegra , available generically as fexofenadine ; comes in 60 mg and 180 mg once daily strengths.    Generic Claritin :  also available as loratidine .     I would also add Flonase steroid nasal spray for the rest of the pollen season  Start the prednisone today as well   only add the augmentin if you develop ear or sinus pain   Taking an antibiotic can create an imbalance in the normal population of bacteria that live in the small intestine.  This imbalance can persist for 3 months.   Taking a probiotic ( Align, Floraque or Culturelle), the generic version of one of these over the counter medications, or an alternative form (kombucha,  Yogurt, or another dietary source) for a minimum of 3 weeks may help prevent a serious antibiotic associated diarrhea  Called clostridium dificile colitis that occurs when the bacteria population is altered .  Taking a probiotic may also prevent vaginitis due to yeast infections and can be continued indefinitely if you feel that it improves your digestion or your elimination (bowels).      I recommend getting the majority of your calcium and Vitamin D  through diet rather than supplements given the recent association of calcium supplements with increased coronary artery calcium scores (You need 1200 to 1800 mg of calcium daily )   Unsweetened almond/coconut milk and soy milk are low carb, cholesterol free  ways to increase your dietary calcium and vitamin D.  Protein shakes are also great sources   Vitamin D intake should be the megadose weekly that I have refilled unless otherwise instructed

## 2024-01-27 NOTE — Assessment & Plan Note (Signed)
 Current risk is 8.5%  But has RA , which I have explained increases her risk for CAD  .  We have discussed risks s benefits of statin. She remains contemplative due to recurrent myalgias and the concern that statin therapy may aggravate her pain . She has a family history of CAD, but not  early.  Lab Results  Component Value Date   CHOL 264 (H) 10/19/2023   HDL 63.60 10/19/2023   LDLCALC 172 (H) 10/19/2023   LDLDIRECT 165.0 07/27/2023   TRIG 141.0 10/19/2023   CHOLHDL 4 10/19/2023

## 2024-01-27 NOTE — Assessment & Plan Note (Signed)
 She remains exhausted and overwhelmed at times by her mother's declining state but is being more careful about  taking care of herself first

## 2024-01-27 NOTE — Telephone Encounter (Signed)
error 

## 2024-01-27 NOTE — Progress Notes (Addendum)
 Subjective:  Patient ID: Melissa Baker, female    DOB: Aug 23, 1966  Age: 58 y.o. MRN: [id number]  CC: The primary encounter diagnosis was Vitamin D  deficiency. Diagnoses of Acquired hypothyroidism, Acute cough, Acute lower respiratory infection, Viral upper respiratory illness, Caregiver burden, Hyperlipidemia LDL goal <130, Bronchitis, acute, with bronchospasm, and Bronchospasm, acute were also pertinent to this visit.   HPI Melissa Baker presents for  Chief Complaint  Patient presents with   Medical Management of Chronic Issues    6 month follow up    1) Respiratory infection started 2 days ago with scratchy throat , RHINITIS AND congestion.  No fevers..  Tmax  100.  ,  no ear pain .  Daughter had a cold recently .  2) caregiver fatigue:  daughter 110 with spina bifida  doing great,  but her  mother ,  is clinically depressed and won't get out of bed   3) RA:  taking meds as directed by Tobie.  Swimming for exercise    Outpatient Medications Prior to Visit  Medication Sig Dispense Refill   Biotin 1000 MCG tablet Take 1,000 mcg by mouth daily.     calcium carbonate (OS-CAL) 600 MG TABS tablet Take 600 mg by mouth daily with breakfast.     cholecalciferol (VITAMIN D3) 25 MCG (1000 UNIT) tablet Take 2,000 Units by mouth daily.     HUMIRA PEN 40 MG/0.4ML PNKT SMARTSIG:40 Milligram(s) SUB-Q Every 2 Weeks     methotrexate (RHEUMATREX) 2.5 MG tablet Take 5 tablets by mouth once a week.     Omega-3 Fatty Acids (OMEGA-3 FISH OIL) 500 MG CAPS Take 1 capsule by mouth daily.     tretinoin  (RETIN-A ) 0.025 % cream Apply a pea sized amount to the entire face QHS. 45 g 11   tretinoin  (RETIN-A ) 0.025 % cream Apply topically at bedtime. 45 g 6   benzonatate  (TESSALON ) 100 MG capsule Take 100-200 mg by mouth 3 (three) times daily as needed.     folic acid  (FOLVITE ) 1 MG tablet Take 2 tablets (2 mg total) by mouth daily. 180 tablet 2   guaiFENesin -codeine  100-10 MG/5ML syrup Take 10 mLs by mouth 3  (three) times daily as needed for cough. 120 mL 0   levothyroxine  (SYNTHROID ) 100 MCG tablet TAKE 1 TABLET BY MOUTH ONCE DAILY IN THE MORNING BEFORE BREAKFAST 90 tablet 1   Vitamin D , Ergocalciferol , (DRISDOL ) 1.25 MG (50000 UNIT) CAPS capsule Take 1 capsule by mouth once a week 4 capsule 0   No facility-administered medications prior to visit.    Review of Systems;  Patient denies headache, fevers, malaise, unintentional weight loss, skin rash, eye pain, sinus congestion and sinus pain, sore throat, dysphagia,  hemoptysis , cough, dyspnea, wheezing, chest pain, palpitations, orthopnea, edema, abdominal pain, nausea, melena, diarrhea, constipation, flank pain, dysuria, hematuria, urinary  Frequency, nocturia, numbness, tingling, seizures,  Focal weakness, Loss of consciousness,  Tremor, insomnia, depression, anxiety, and suicidal ideation.      Objective:  BP 110/78   Pulse 85   Temp 98.5 F (36.9 C) (Oral)   Ht 5' 7 (1.702 m)   Wt 170 lb 3.2 oz (77.2 kg)   SpO2 95%   BMI 26.66 kg/m   BP Readings from Last 3 Encounters:  07/28/24 134/82  01/27/24 110/78  11/10/23 130/82    Wt Readings from Last 3 Encounters:  07/28/24 174 lb 9.6 oz (79.2 kg)  01/27/24 170 lb 3.2 oz (77.2 kg)  11/10/23 170 lb 6.4 oz (  77.3 kg)    Physical Exam Vitals reviewed.  Constitutional:      General: She is not in acute distress.    Appearance: Normal appearance. She is normal weight. She is not ill-appearing, toxic-appearing or diaphoretic.  HENT:     Head: Normocephalic.  Eyes:     General: No scleral icterus.       Right eye: No discharge.        Left eye: No discharge.     Conjunctiva/sclera: Conjunctivae normal.  Cardiovascular:     Rate and Rhythm: Normal rate and regular rhythm.     Heart sounds: Normal heart sounds.  Pulmonary:     Effort: Pulmonary effort is normal. No respiratory distress.     Breath sounds: Normal breath sounds.  Musculoskeletal:        General: Normal range of  motion.  Skin:    General: Skin is warm and dry.  Neurological:     General: No focal deficit present.     Mental Status: She is alert and oriented to person, place, and time. Mental status is at baseline.  Psychiatric:        Mood and Affect: Mood normal.        Behavior: Behavior normal.        Thought Content: Thought content normal.        Judgment: Judgment normal.    Lab Results  Component Value Date   HGBA1C 5.7 07/28/2024   HGBA1C 5.6 07/27/2023   HGBA1C 5.6 07/25/2022    Lab Results  Component Value Date   CREATININE 0.60 07/28/2024   CREATININE 0.65 10/19/2023   CREATININE 0.61 07/27/2023    Lab Results  Component Value Date   WBC 7.6 07/28/2024   HGB 13.9 07/28/2024   HCT 42.2 07/28/2024   PLT 282.0 07/28/2024   GLUCOSE 87 07/28/2024   CHOL 265 (H) 07/28/2024   TRIG 225.0 (H) 07/28/2024   HDL 63.20 07/28/2024   LDLDIRECT 171.0 07/28/2024   LDLCALC 157 (H) 07/28/2024   ALT 17 07/28/2024   AST 15 07/28/2024   NA 139 07/28/2024   K 4.3 07/28/2024   CL 103 07/28/2024   CREATININE 0.60 07/28/2024   BUN 15 07/28/2024   CO2 29 07/28/2024   TSH 1.89 07/28/2024   HGBA1C 5.7 07/28/2024    No results found.  Assessment & Plan:  .Vitamin D  deficiency -     VITAMIN D  25 Hydroxy (Vit-D Deficiency, Fractures)  Acquired hypothyroidism  Acute cough -     POCT Influenza A/B -     POC COVID-19 BinaxNow  Acute lower respiratory infection  Viral upper respiratory illness Assessment & Plan: COVID AND FLU ARE NEGATIE .   allergic rhinitis.  Prednisone  and antihistamine recommended.  Antibiotic advised if feves,  purulent nasal discharge  facial or ear pain develops   Caregiver burden Assessment & Plan: She remains exhausted and overwhelmed at times by her mother's declining state but is being more careful about  taking care of herself first    Hyperlipidemia LDL goal <130 Assessment & Plan: Current risk is 8.5%  But has RA , which I have explained  increases her risk for CAD  .  We have discussed risks s benefits of statin. She remains contemplative due to recurrent myalgias and the concern that statin therapy may aggravate her pain . She has a family history of CAD, but not  early.  Lab Results  Component Value Date   CHOL 264 (H) 10/19/2023  HDL 63.60 10/19/2023   LDLCALC 172 (H) 10/19/2023   LDLDIRECT 165.0 07/27/2023   TRIG 141.0 10/19/2023   CHOLHDL 4 10/19/2023       Bronchitis, acute, with bronchospasm  Bronchospasm, acute -     Respiratory virus panel     I spent 34 minutes on the day of this face to face encounter reviewing patient's  most recent visit with rheumatology, ,  prior relevant surgical and non surgical procedures, recent  labs and imaging studies, counseling on stress  management,  reviewing the assessment and plan with patient, and post visit ordering and reviewing of  diagnostics and therapeutics with patient  .   Follow-up: Return in about 6 months (around 07/28/2024) for physical.   Verneita LITTIE Kettering, MD

## 2024-01-27 NOTE — Assessment & Plan Note (Signed)
 Managed with methotrexate 12.5 mg weekly and Folic Acid 1 mg Daily,  previously on Humira since 2021; Insurance required change to [employer]

## 2024-01-27 NOTE — Assessment & Plan Note (Addendum)
 COVID AND FLU ARE NEGATIE .   allergic rhinitis.  Prednisone and antihistamine recommended.  Antibiotic advised if feves,  purulent nasal discharge  facial or ear pain develops

## 2024-01-31 ENCOUNTER — Encounter: Payer: Self-pay | Admitting: Internal Medicine

## 2024-01-31 LAB — RESPIRATORY VIRUS PANEL
Adenovirus B: NOT DETECTED
HUMAN PARAINFLU VIRUS 1: NOT DETECTED
HUMAN PARAINFLU VIRUS 2: NOT DETECTED
HUMAN PARAINFLU VIRUS 3: NOT DETECTED
INFLUENZA A SUBTYPE H1: NOT DETECTED
INFLUENZA A SUBTYPE H3: NOT DETECTED
Influenza A: NOT DETECTED
Influenza B: NOT DETECTED
Metapneumovirus: DETECTED — AB
Respiratory Syncytial Virus A: NOT DETECTED
Respiratory Syncytial Virus B: NOT DETECTED
Rhinovirus: NOT DETECTED

## 2024-02-02 DIAGNOSIS — G8929 Other chronic pain: Secondary | ICD-10-CM | POA: Diagnosis not present

## 2024-02-02 DIAGNOSIS — M25561 Pain in right knee: Secondary | ICD-10-CM | POA: Diagnosis not present

## 2024-02-02 DIAGNOSIS — M1711 Unilateral primary osteoarthritis, right knee: Secondary | ICD-10-CM | POA: Diagnosis not present

## 2024-02-02 DIAGNOSIS — M1712 Unilateral primary osteoarthritis, left knee: Secondary | ICD-10-CM | POA: Diagnosis not present

## 2024-02-02 DIAGNOSIS — M25562 Pain in left knee: Secondary | ICD-10-CM | POA: Diagnosis not present

## 2024-02-19 ENCOUNTER — Other Ambulatory Visit: Payer: Self-pay | Admitting: Internal Medicine

## 2024-02-19 NOTE — Telephone Encounter (Signed)
 Refilled: 01/25/2024 Last OV: 01/27/2024 Next OV: 07/28/2024 Last Vitamin D  level: 01/27/2024 was 31.68

## 2024-03-21 ENCOUNTER — Other Ambulatory Visit: Payer: Self-pay | Admitting: Internal Medicine

## 2024-03-22 NOTE — Telephone Encounter (Signed)
 Refilled: 02/20/2024 Last OV: 01/27/2024 Next OV: 07/28/2024 Last Vitamin D  level: 01/27/2024 was 31.68

## 2024-04-20 ENCOUNTER — Other Ambulatory Visit: Payer: Self-pay | Admitting: Internal Medicine

## 2024-05-10 DIAGNOSIS — M0579 Rheumatoid arthritis with rheumatoid factor of multiple sites without organ or systems involvement: Secondary | ICD-10-CM | POA: Diagnosis not present

## 2024-05-10 DIAGNOSIS — Z7960 Long term (current) use of unspecified immunomodulators and immunosuppressants: Secondary | ICD-10-CM | POA: Diagnosis not present

## 2024-05-10 DIAGNOSIS — M8588 Other specified disorders of bone density and structure, other site: Secondary | ICD-10-CM | POA: Diagnosis not present

## 2024-05-10 DIAGNOSIS — M170 Bilateral primary osteoarthritis of knee: Secondary | ICD-10-CM | POA: Diagnosis not present

## 2024-05-11 ENCOUNTER — Other Ambulatory Visit: Payer: Self-pay | Admitting: Internal Medicine

## 2024-05-14 ENCOUNTER — Other Ambulatory Visit: Payer: Self-pay | Admitting: Internal Medicine

## 2024-05-15 NOTE — Telephone Encounter (Signed)
 We continue to receive this refill request every month.  If pt needs to stay once this weekly megadose, can we increase quantity and add refills?  LOV: 01/27/24 NOV: 07/28/24  Component Ref Range & Units (hover) 3 mo ago 6 mo ago 9 mo ago 1 yr ago  VITD 31.68 34.06 25.62 Low  26.27 Low   Resulting Agency [place] [facility] [place] [facility] [place] [facility]  [facility]

## 2024-05-19 DIAGNOSIS — M170 Bilateral primary osteoarthritis of knee: Secondary | ICD-10-CM | POA: Diagnosis not present

## 2024-06-08 ENCOUNTER — Encounter: Payer: Self-pay | Admitting: Dermatology

## 2024-06-08 ENCOUNTER — Ambulatory Visit (INDEPENDENT_AMBULATORY_CARE_PROVIDER_SITE_OTHER): Payer: 59 | Admitting: Dermatology

## 2024-06-08 DIAGNOSIS — L988 Other specified disorders of the skin and subcutaneous tissue: Secondary | ICD-10-CM

## 2024-06-08 NOTE — Progress Notes (Signed)
 Follow-Up Visit   Subjective  Melissa Baker is a 58 y.o. female who presents for the following: Botox for facial elastosis Would like to ask if she needs more botox frown complex   The following portions of the chart were reviewed this encounter and updated as appropriate: medications, allergies, medical history  Review of Systems:  No other skin or systemic complaints except as noted in HPI or Assessment and Plan.  Objective  Well appearing patient in no apparent distress; mood and affect are within normal limits.  A focused examination was performed of the face.  Relevant physical exam findings are noted in the Assessment and Plan.   Injection map photo Frown   Relaxed       Assessment & Plan    Facial Elastosis  Discussed after 2 weeks and can frown and not seeing a crease do not recommend more botox at frown complex   New photos taken today see above  20 units of botox injected today  Frown Complex - 20 units  Location: See attached image  Informed consent: Discussed risks (infection, pain, bleeding, bruising, swelling, allergic reaction, paralysis of nearby muscles, eyelid droop, double vision, neck weakness, difficulty breathing, headache, undesirable cosmetic result, and need for additional treatment) and benefits of the procedure, as well as the alternatives.  Informed consent was obtained.  Preparation: The area was cleansed with alcohol.  Procedure Details:  Botox was injected into the dermis with a 30-gauge needle. Pressure applied to any bleeding. Ice packs offered for swelling.  Lot Number:  [id number] Expiration:  02/2026  Total Units Injected:  20  Plan: Tylenol may be used for headache.  Allow 2 weeks before returning to clinic for additional dosing as needed. Patient will call for any problems.  MELASMA Exam: reticulated hyperpigmented patches at face  Melasma is a chronic; persistent condition of hyperpigmented patches generally on the  face, worse in summer due to higher UV exposure.    Heredity; thyroid  disease; sun exposure; pregnancy; birth control pills; epilepsy medication and darker skin may predispose to Melasma.   Recommendations include: - Sun avoidance and daily broad spectrum (UVA/UVB) tinted mineral sunscreen SPF 30+, with Zinc or Titanium Dioxide. - Rx topical bleaching creams (i.e. hydroquinone) is a common treatment but should not be used long term.  Hydroquinones may be mixed with retinoids; vitamin C; steroids; Kojic Acid. - Alastin A-luminate, retinoids, vitamin C, topical tranexamic acid, glycolic acid and kojic acid can be used for brightening while on break from hydroquinone - Rx Azelaic Acid is also a treatment option that is safe for pregnancy (Category B). - OTC Heliocare can be helpful in control and prevention. - Oral Rx with Tranexamic Acid 250 mg - 650 mg po daily can be used for moderate to severe cases especially during summer (contraindications include pregnancy; lactation; hx of PE; hx of DVT; clotting disorder; heart disease; anticoagulant use and upcoming long trips)   - Chemical peels (would need multiple for best result).  - Lasers and  Microdermabrasion may also be helpful adjunct treatments.  Treatment Plan: Discussed could consider other treatment in future if interested.  Patient will consider.  Cont SM19 Hydroquinone 12% / Kojic Acid 6% / Niacinamide 2% / Vitamin C 1% Cream QD x 3 months then stop for 3 months.  Cont Retin A 0.025% cream apply to face at bedtime rx goes to [facility]   Return for 3 to 4 month botox .  LILLETTE Eleanor Blush, CMA, am acting as  scribe for Alm Rhyme, MD.   Documentation: I have reviewed the above documentation for accuracy and completeness, and I agree with the above.  Alm Rhyme, MD

## 2024-06-08 NOTE — Patient Instructions (Addendum)
 Instructions for [facility] Medications  One or more of your medications was sent to the [facility] mail order compounding pharmacy. You will receive an email from them and can purchase the medicine through that link. It will then be mailed to your home at the address you confirmed. If for any reason you do not receive an email from them, please check your spam folder. If you still do not find the email, please let us  know. [facility] phone number is [phone].    Melasma is a chronic; persistent condition of hyperpigmented patches generally on the face, worse in summer due to higher UV exposure.    Heredity; thyroid  disease; sun exposure; pregnancy; birth control pills; epilepsy medication and darker skin may predispose to Melasma.   Recommendations include: - Sun avoidance and daily broad spectrum (UVA/UVB) tinted mineral sunscreen SPF 30+, with Zinc or Titanium Dioxide. - Rx topical bleaching creams (i.e. hydroquinone) is a common treatment but should not be used long term.  Hydroquinones may be mixed with retinoids; vitamin C; steroids; Kojic Acid. - Alastin A-luminate, retinoids, vitamin C, topical tranexamic acid, glycolic acid and kojic acid can be used for brightening while on break from hydroquinone - Rx Azelaic Acid is also a treatment option that is safe for pregnancy (Category B). - OTC Heliocare can be helpful in control and prevention. - Oral Rx with Tranexamic Acid 250 mg - 650 mg po daily can be used for moderate to severe cases especially during summer (contraindications include pregnancy; lactation; hx of PE; hx of DVT; clotting disorder; heart disease; anticoagulant use and upcoming long trips)   - Chemical peels (would need multiple for best result).  - Lasers and  Microdermabrasion may also be helpful adjunct treatments.    Due to recent changes in healthcare laws, you may see results of your pathology and/or laboratory studies on MyChart before the doctors  have had a chance to review them. We understand that in some cases there may be results that are confusing or concerning to you. Please understand that not all results are received at the same time and often the doctors may need to interpret multiple results in order to provide you with the best plan of care or course of treatment. Therefore, we ask that you please give us  2 business days to thoroughly review all your results before contacting the office for clarification. Should we see a critical lab result, you will be contacted sooner.   If You Need Anything After Your Visit  If you have any questions or concerns for your doctor, please call our main line at [phone] and press option 4 to reach your doctor's medical assistant. If no one answers, please leave a voicemail as directed and we will return your call as soon as possible. Messages left after 4 pm will be answered the following business day.   You may also send us  a message via MyChart. We typically respond to MyChart messages within 1-2 business days.  For prescription refills, please ask your pharmacy to contact our office. Our fax number is [phone].  If you have an urgent issue when the clinic is closed that cannot wait until the next business day, you can page your doctor at the number below.    Please note that while we do our best to be available for urgent issues outside of office hours, we are not available 24/7.   If you have an urgent issue and are unable to reach us , you may choose  to seek medical care at your doctor's office, retail clinic, urgent care center, or emergency room.  If you have a medical emergency, please immediately call 911 or go to the emergency department.  Pager Numbers  - Dr. Hester: [phone]  - Dr. Jackquline: [phone]  - Dr. Claudene: [phone]   - Dr. Raymund: [phone]  In the event of inclement weather, please call our main line at [phone] for an update on the status  of any delays or closures.  Dermatology Medication Tips: Please keep the boxes that topical medications come in in order to help keep track of the instructions about where and how to use these. Pharmacies typically print the medication instructions only on the boxes and not directly on the medication tubes.   If your medication is too expensive, please contact our office at [phone] option 4 or send us  a message through MyChart.   We are unable to tell what your co-pay for medications will be in advance as this is different depending on your insurance coverage. However, we may be able to find a substitute medication at lower cost or fill out paperwork to get insurance to cover a needed medication.   If a prior authorization is required to get your medication covered by your insurance company, please allow us  1-2 business days to complete this process.  Drug prices often vary depending on where the prescription is filled and some pharmacies may offer cheaper prices.  The website www.goodrx.com contains coupons for medications through different pharmacies. The prices here do not account for what the cost may be with help from insurance (it may be cheaper with your insurance), but the website can give you the price if you did not use any insurance.  - You can print the associated coupon and take it with your prescription to the pharmacy.  - You may also stop by our office during regular business hours and pick up a GoodRx coupon card.  - If you need your prescription sent electronically to a different pharmacy, notify our office through [facility] or by phone at [phone] option 4.     Si Usted Necesita Algo Despus de Su Visita  Tambin puede enviarnos un mensaje a travs de Clinical cytogeneticist. Por lo general respondemos a los mensajes de MyChart en el transcurso de 1 a 2 das hbiles.  Para renovar recetas, por favor pida a su farmacia que se ponga en contacto con nuestra oficina.  [place] de fax es [place] [phone].  Si tiene un asunto urgente cuando la clnica est cerrada y que no puede esperar hasta el siguiente da hbil, puede llamar/localizar a su doctor(a) al nmero que aparece a continuacin.   Por favor, tenga en cuenta que aunque hacemos todo lo posible para estar disponibles para asuntos urgentes fuera del horario de [place], no estamos disponibles las 24 horas del da, los 7 [address]  [address] de la [place].   Si tiene un problema urgente y no puede comunicarse con nosotros, puede optar por buscar atencin mdica  en el consultorio de su doctor(a), en una clnica privada, en un centro de atencin urgente o en una sala de emergencias.  Si tiene Engineer, drilling, por favor llame inmediatamente al 911 o vaya a la sala de emergencias.  Nmeros de bper  - Dr. Hester: [phone]  - Dra. Jackquline: [phone]  - Dr. Claudene: [phone]  - Dra. Kitts: [phone]  En caso de inclemencias del [place], por favor llame a nuestra lnea principal al [phone] para ignacia  actualizacin sobre el estado de cualquier retraso o cierre.  Consejos para la medicacin en dermatologa: Por favor, guarde las cajas en las que vienen los medicamentos de uso tpico para ayudarle a seguir las instrucciones sobre dnde y cmo usarlos. Las farmacias generalmente imprimen las instrucciones del medicamento slo en las cajas y no directamente en los tubos del [place].   Si su medicamento es muy caro, por favor, pngase en contacto con landry rieger llamando al [phone] y presione la opcin 4 o envenos un mensaje a travs de Clinical cytogeneticist.   No podemos decirle cul ser su copago por los medicamentos por adelantado ya que esto es diferente dependiendo de la cobertura de su seguro. Sin embargo, es posible que podamos encontrar un medicamento sustituto a Audiological scientist un formulario para que el seguro cubra el medicamento que se considera necesario.   Si se requiere una  autorizacin previa para que su compaa de seguros malta su medicamento, por favor permtanos de 1 a 2 das hbiles para completar este proceso.  Los precios de los medicamentos varan con frecuencia dependiendo del Environmental consultant de dnde se surte la receta y alguna farmacias pueden ofrecer precios ms baratos.  El sitio web www.goodrx.com tiene cupones para medicamentos de Health and safety inspector. Los precios aqu no tienen en cuenta lo que podra costar con la ayuda del seguro (puede ser ms barato con su seguro), pero el sitio web puede darle el precio si no utiliz Tourist information centre manager.  - Puede imprimir el cupn correspondiente y llevarlo con su receta a la farmacia.  - Tambin puede pasar por nuestra oficina durante el horario de atencin regular y Education officer, museum una tarjeta de cupones de GoodRx.  - Si necesita que su receta se enve electrnicamente a una farmacia diferente, informe a nuestra oficina a travs de MyChart de [place] o por telfono llamando al [phone] y presione la opcin 4.

## 2024-06-12 ENCOUNTER — Other Ambulatory Visit: Payer: Self-pay | Admitting: Internal Medicine

## 2024-07-13 ENCOUNTER — Other Ambulatory Visit: Payer: Self-pay | Admitting: Internal Medicine

## 2024-07-13 NOTE — Telephone Encounter (Signed)
 Last refilled: 06/15/2024 Last OV: 01/27/2024 Next OV: 07/28/2024 Last Vitamin D  level: 31.68 on 01/27/2024

## 2024-07-26 DIAGNOSIS — M19071 Primary osteoarthritis, right ankle and foot: Secondary | ICD-10-CM | POA: Diagnosis not present

## 2024-07-26 DIAGNOSIS — M0579 Rheumatoid arthritis with rheumatoid factor of multiple sites without organ or systems involvement: Secondary | ICD-10-CM | POA: Diagnosis not present

## 2024-07-27 ENCOUNTER — Telehealth: Payer: Self-pay

## 2024-07-27 NOTE — Telephone Encounter (Signed)
 Copied from CRM #[id number]. Topic: Clinical - Medical Advice >> Jul 27, 2024  2:40 PM Anairis L wrote: Reason for CRM: Patient has a app tomorrow but did not complete blood work would like to know if she should keep app or come in earlier for blood work.  Please call patient back .

## 2024-07-27 NOTE — Telephone Encounter (Signed)
 Spoke with pt to let her know that she can keep her appt tomorrow and we can do her blood work well she is here. Pt gave a verbal understanding.

## 2024-07-28 ENCOUNTER — Encounter: Payer: Self-pay | Admitting: Internal Medicine

## 2024-07-28 ENCOUNTER — Ambulatory Visit (INDEPENDENT_AMBULATORY_CARE_PROVIDER_SITE_OTHER): Admitting: Internal Medicine

## 2024-07-28 VITALS — BP 134/82 | HR 72 | Ht 67.0 in | Wt 174.6 lb

## 2024-07-28 DIAGNOSIS — M85839 Other specified disorders of bone density and structure, unspecified forearm: Secondary | ICD-10-CM

## 2024-07-28 DIAGNOSIS — E785 Hyperlipidemia, unspecified: Secondary | ICD-10-CM

## 2024-07-28 DIAGNOSIS — E039 Hypothyroidism, unspecified: Secondary | ICD-10-CM | POA: Diagnosis not present

## 2024-07-28 DIAGNOSIS — R7301 Impaired fasting glucose: Secondary | ICD-10-CM

## 2024-07-28 DIAGNOSIS — R5383 Other fatigue: Secondary | ICD-10-CM | POA: Diagnosis not present

## 2024-07-28 DIAGNOSIS — E559 Vitamin D deficiency, unspecified: Secondary | ICD-10-CM | POA: Diagnosis not present

## 2024-07-28 DIAGNOSIS — Z636 Dependent relative needing care at home: Secondary | ICD-10-CM

## 2024-07-28 DIAGNOSIS — Z0000 Encounter for general adult medical examination without abnormal findings: Secondary | ICD-10-CM

## 2024-07-28 DIAGNOSIS — M0579 Rheumatoid arthritis with rheumatoid factor of multiple sites without organ or systems involvement: Secondary | ICD-10-CM

## 2024-07-28 DIAGNOSIS — Z23 Encounter for immunization: Secondary | ICD-10-CM | POA: Diagnosis not present

## 2024-07-28 DIAGNOSIS — Z1231 Encounter for screening mammogram for malignant neoplasm of breast: Secondary | ICD-10-CM

## 2024-07-28 DIAGNOSIS — N950 Postmenopausal bleeding: Secondary | ICD-10-CM

## 2024-07-28 LAB — COMPREHENSIVE METABOLIC PANEL WITH GFR
ALT: 17 U/L (ref 0–35)
AST: 15 U/L (ref 0–37)
Albumin: 4.7 g/dL (ref 3.5–5.2)
Alkaline Phosphatase: 64 U/L (ref 39–117)
BUN: 15 mg/dL (ref 6–23)
CO2: 29 meq/L (ref 19–32)
Calcium: 9.5 mg/dL (ref 8.4–10.5)
Chloride: 103 meq/L (ref 96–112)
Creatinine, Ser: 0.6 mg/dL (ref 0.40–1.20)
GFR: 97.73 mL/min (ref >60.00)
Glucose, Bld: 87 mg/dL (ref 70–99)
Potassium: 4.3 meq/L (ref 3.5–5.1)
Sodium: 139 meq/L (ref 135–145)
Total Bilirubin: 0.5 mg/dL (ref 0.2–1.2)
Total Protein: 7.1 g/dL (ref 6.0–8.3)

## 2024-07-28 LAB — CBC WITH DIFFERENTIAL/PLATELET
Basophils Absolute: 0.1 K/uL (ref 0.0–0.1)
Basophils Relative: 0.7 % (ref 0.0–3.0)
Eosinophils Absolute: 0 K/uL (ref 0.0–0.7)
Eosinophils Relative: 0.4 % (ref 0.0–5.0)
HCT: 42.2 % (ref 36.0–46.0)
Hemoglobin: 13.9 g/dL (ref 12.0–15.0)
Lymphocytes Relative: 39.7 % (ref 12.0–46.0)
Lymphs Abs: 3 K/uL (ref 0.7–4.0)
MCHC: 32.9 g/dL (ref 30.0–36.0)
MCV: 91.1 fl (ref 78.0–100.0)
Monocytes Absolute: 0.3 K/uL (ref 0.1–1.0)
Monocytes Relative: 4.5 % (ref 3.0–12.0)
Neutro Abs: 4.2 K/uL (ref 1.4–7.7)
Neutrophils Relative %: 54.7 % (ref 43.0–77.0)
Platelets: 282 K/uL (ref 150.0–400.0)
RBC: 4.63 Mil/uL (ref 3.87–5.11)
RDW: 15.1 % (ref 11.5–15.5)
WBC: 7.6 K/uL (ref 4.0–10.5)

## 2024-07-28 LAB — LIPID PANEL
Cholesterol: 265 mg/dL — ABNORMAL HIGH (ref 0–200)
HDL: 63.2 mg/dL (ref >39.00)
LDL Cholesterol: 157 mg/dL — ABNORMAL HIGH (ref 0–99)
NonHDL: 202.08
Total CHOL/HDL Ratio: 4
Triglycerides: 225 mg/dL — ABNORMAL HIGH (ref 0.0–149.0)
VLDL: 45 mg/dL — ABNORMAL HIGH (ref 0.0–40.0)

## 2024-07-28 LAB — LDL CHOLESTEROL, DIRECT: Direct LDL: 171 mg/dL

## 2024-07-28 LAB — HEMOGLOBIN A1C: Hgb A1c MFr Bld: 5.7 % (ref 4.6–6.5)

## 2024-07-28 LAB — VITAMIN D 25 HYDROXY (VIT D DEFICIENCY, FRACTURES): VITD: 31 ng/mL (ref 30.00–100.00)

## 2024-07-28 LAB — TSH: TSH: 1.89 u[IU]/mL (ref 0.35–5.50)

## 2024-07-28 MED ORDER — CIPROFLOXACIN HCL 250 MG PO TABS: 250.0000 mg | ORAL_TABLET | Freq: Two times a day (BID) | ORAL | 0 refills | Status: AC

## 2024-07-28 MED ORDER — AZITHROMYCIN 500 MG PO TABS: 500.0000 mg | ORAL_TABLET | Freq: Every day | ORAL | 0 refills | Status: AC

## 2024-07-28 MED ORDER — FOLIC ACID 1 MG PO TABS: 2.0000 mg | ORAL_TABLET | Freq: Every day | ORAL | 0 refills | Status: AC

## 2024-07-28 MED ORDER — OSELTAMIVIR PHOSPHATE 75 MG PO CAPS: 75.0000 mg | ORAL_CAPSULE | Freq: Two times a day (BID) | ORAL | 0 refills | Status: AC

## 2024-07-28 MED ORDER — PREDNISONE 10 MG PO TABS: ORAL_TABLET | ORAL | 0 refills | Status: AC

## 2024-07-28 MED ORDER — LEVOTHYROXINE SODIUM 100 MCG PO TABS
ORAL_TABLET | ORAL | 1 refills | Status: AC
Start: 2024-07-28 — End: ?

## 2024-07-28 NOTE — Progress Notes (Signed)
 Patient ID: Melissa Baker, female    DOB: 1966/07/30  Age: 58 y.o. MRN: [id number]  The patient is here for annual preventive examination and management of other chronic and acute problems.   The risk factors are reflected in the social history.   The roster of all physicians providing medical care to patient - is listed in the Snapshot section of the chart.   Activities of daily living:  The patient is 100% independent in all ADLs: dressing, toileting, feeding as well as independent mobility   Home safety : The patient has smoke detectors in the home. They wear seatbelts.  There are no unsecured firearms at home. There is no violence in the home.    There is no risks for hepatitis, STDs or HIV. There is no   history of blood transfusion. They have no travel history to infectious disease endemic areas of the world.   The patient has seen their dentist in the last six month. They have seen their eye doctor in the last year. The patinet  denies slight hearing difficulty with regard to whispered voices and some television programs.  They have deferred audiologic testing in the last year.  They do not  have excessive sun exposure. Discussed the need for sun protection: hats, long sleeves and use of sunscreen if there is significant sun exposure.    Diet: the importance of a healthy diet is discussed. They do have a healthy diet.   The benefits of regular aerobic exercise were discussed. The patient  has been unable to exercise due to  multiple orthopeidc /rheumatoloogic issues or vegative symptoms of depression- irritability, change in appetite, anhedonia, sadness/tearfullness.   The following portions of the patient's history were reviewed and updated as appropriate: allergies, current medications, past family history, past medical history,  past surgical history, past social history  and problem list.   Visual acuity was not assessed per patient preference since the patient has regular follow up  with an  ophthalmologist. Hearing and body mass index were assessed and reviewed.    During the course of the visit the patient was educated and counseled about appropriate screening and preventive services including : fall prevention , diabetes screening, nutrition counseling, colorectal cancer screening, and recommended immunizations.    Chief Complaint:  1) joint pain pain:  seeing Ashley for RA associated ankle pain,  Hooten for knee pain  ,  receiving steroid injections /monovisc injections   2) RA:  managed by [facility] Rheum/ ESR 7 in July She is on methotrexate, Hyrimoz. She is taking sulindac for pain as needed. She denies any joint swelling and pains of the hands. She continues aquatic therapy on her own. She did get Monovisc injection which is helping the knee pains. She has no fever, infection, skin rash, nasal/oral ulcer, pleurisy or hospitalization.    3) osteopenia: by march 2025 DEXA      Review of Symptoms  Patient denies headache, fevers, malaise, unintentional weight loss, skin rash, eye pain, sinus congestion and sinus pain, sore throat, dysphagia,  hemoptysis , cough, dyspnea, wheezing, chest pain, palpitations, orthopnea, edema, abdominal pain, nausea, melena, diarrhea, constipation, flank pain, dysuria, hematuria, urinary  Frequency, nocturia, numbness, tingling, seizures,  Focal weakness, Loss of consciousness,  Tremor, insomnia, depression, anxiety, and suicidal ideation.    Physical Exam:  BP 134/82   Pulse 72   Ht 5' 7 (1.702 m)   Wt 174 lb 9.6 oz (79.2 kg)   SpO2 98%   BMI 27.35  kg/m    Physical Exam Vitals reviewed.  Constitutional:      General: She is not in acute distress.    Appearance: Normal appearance. She is well-developed and normal weight. She is not ill-appearing, toxic-appearing or diaphoretic.  HENT:     Head: Normocephalic.     Right Ear: Tympanic membrane, ear canal and external ear normal. There is no impacted cerumen.     Left Ear: Tympanic  membrane, ear canal and external ear normal. There is no impacted cerumen.     Nose: Nose normal.     Mouth/Throat:     Mouth: Mucous membranes are moist.     Pharynx: Oropharynx is clear.  Eyes:     General: No scleral icterus.       Right eye: No discharge.        Left eye: No discharge.     Conjunctiva/sclera: Conjunctivae normal.     Pupils: Pupils are equal, round, and reactive to light.  Neck:     Thyroid : No thyromegaly.     Vascular: No carotid bruit or JVD.  Cardiovascular:     Rate and Rhythm: Normal rate and regular rhythm.     Heart sounds: Normal heart sounds.  Pulmonary:     Effort: Pulmonary effort is normal. No respiratory distress.     Breath sounds: Normal breath sounds.  Chest:  Breasts:    Breasts are symmetrical.     Right: Normal. No swelling, inverted nipple, mass, nipple discharge, skin change or tenderness.     Left: Normal. No swelling, inverted nipple, mass, nipple discharge, skin change or tenderness.  Abdominal:     General: Bowel sounds are normal.     Palpations: Abdomen is soft. There is no mass.     Tenderness: There is no abdominal tenderness. There is no guarding or rebound.  Musculoskeletal:        General: Normal range of motion.     Cervical back: Normal range of motion and neck supple.  Lymphadenopathy:     Cervical: No cervical adenopathy.     Upper Body:     Right upper body: No supraclavicular, axillary or pectoral adenopathy.     Left upper body: No supraclavicular, axillary or pectoral adenopathy.  Skin:    General: Skin is warm and dry.  Neurological:     General: No focal deficit present.     Mental Status: She is alert and oriented to person, place, and time. Mental status is at baseline.  Psychiatric:        Mood and Affect: Mood normal.        Behavior: Behavior normal.        Thought Content: Thought content normal.        Judgment: Judgment normal.     Assessment and Plan: Hyperlipidemia LDL goal <130 -     Lipid  panel -     LDL cholesterol, direct  Acquired hypothyroidism Assessment & Plan:  She has no symptoms of under or overactive Thyroid  on current levothyroxine  dose of 100 mcg . Repeat TSH normal   Lab Results  Component Value Date   TSH 1.89 07/28/2024     Orders: -     Levothyroxine  Sodium; TAKE 1 TABLET BY MOUTH ONCE DAILY IN THE MORNING BEFORE BREAKFAST  Dispense: 90 tablet; Refill: 1 -     TSH  Impaired fasting glucose -     Comprehensive metabolic panel with GFR -     Hemoglobin A1c  Other  fatigue -     CBC with Differential/Platelet  Encounter for screening mammogram for malignant neoplasm of breast -     3D Screening Mammogram, Left and Right; Future  Encounter for preventive health examination Assessment & Plan: age appropriate education and counseling updated, referrals for preventative services and immunizations addressed, dietary and smoking counseling addressed, most recent labs reviewed.  I have personally reviewed and have noted:   1) the patient's medical and social history 2) The pt's use of alcohol, tobacco, and illicit drugs 3) The patient's current medications and supplements 4) Functional ability including ADL's, fall risk, home safety risk, hearing and visual impairment 5) Diet and physical activities 6) Evidence for depression or mood disorder 7) The patient's height, weight, and BMI have been recorded in the chart   I have made referrals, and provided counseling and education based on review of the above    Rheumatoid arthritis involving multiple sites with positive rheumatoid factor (HCC) Assessment & Plan: Managed with methotrexate 12.5 mg weekly and Folic Acid  1 mg Daily,  and Hyrimoz.  Sees Fowler for ankle pain managed with prn injections and Hooten for knee pain managed with monovisc.      Vitamin D  deficiency -     VITAMIN D  25 Hydroxy (Vit-D Deficiency, Fractures)  Osteopenia determined by dual energy x-ray photon absorptiometry (DEXA)  scan of forearm Assessment & Plan: Weight bearing exericse recommended in addition to aquatic therapy which she is doing daily .  She cah been using calcium and Vitamin d  supplementation    Post-menopausal bleeding Assessment & Plan: She was referred  in 2024 for  endometrial evaluation and per patient a  biopsy was reportedly normal.  .  Records requested from [facility] ob g[facility] were scanned into the chart  Oct 2024 , but not retrievable due to a formatting error    Need for influenza vaccination -     Flu vaccine HIGH DOSE PF(Fluzone Trivalent)  Caregiver burden Assessment & Plan: She has has a brief respite in caring for her mother 24/7  who is currently rehabbing in a facilty .  Her   mother has RA and dementia,  and patient  is having increased difficulty managing her at home due to her immobility and dementia.  Patient is also caring for a daughter with spina bifida as well.     Other orders -     Folic Acid ; Take 2 tablets (2 mg total) by mouth daily.  Dispense: 180 tablet; Refill: 0 -     predniSONE ; 6 tablets on Day 1 , then reduce by 1 tablet daily until gone  Dispense: 21 tablet; Refill: 0 -     Azithromycin ; Take 1 tablet (500 mg total) by mouth daily.  Dispense: 7 tablet; Refill: 0 -     Ciprofloxacin  HCl; Take 1 tablet (250 mg total) by mouth 2 (two) times daily for 3 days.  Dispense: 6 tablet; Refill: 0 -     Oseltamivir Phosphate; Take 1 capsule (75 mg total) by mouth 2 (two) times daily.  Dispense: 10 capsule; Refill: 0    Return in about 1 year (around 07/28/2025).  Verneita LITTIE Kettering, MD

## 2024-07-28 NOTE — Assessment & Plan Note (Signed)
 Weight bearing exericse recommended in addition to aquatic therapy which she is doing daily .  She cah been using calcium and Vitamin d  supplementation

## 2024-07-28 NOTE — Assessment & Plan Note (Signed)
 She has has a brief respite in caring for her mother 24/7  who is currently rehabbing in a facilty .  Her   mother has RA and dementia,  and patient  is having increased difficulty managing her at home due to her immobility and dementia.  Patient is also caring for a daughter with spina bifida as well.

## 2024-07-28 NOTE — Assessment & Plan Note (Addendum)
 Managed with methotrexate 12.5 mg weekly and Folic Acid  1 mg Daily,  and Hyrimoz.  Sees Fowler for ankle pain managed with prn injections and Hooten for knee pain managed with monovisc.

## 2024-07-28 NOTE — Assessment & Plan Note (Signed)

## 2024-07-28 NOTE — Patient Instructions (Addendum)
 YOUR MAMMOGRAM has been ordered , PLEASE CALL AND GET THIS SCHEDULED! [facility] - call [phone]  Veronda does  the scheduling for [facility] as well       You can take 3000 mg of acetominophen (tylenol) every day safely  In divided doses (750 mg  every 6 hours  Or 1000 mg every 8 hours.)     I have sent Tamiflu,  Prednisone  taper,  azithromycin  and cipro  to [facility]  pharmacy to take on your trip to [place]   Start the tamiflu once daily if you have been exposed to the flu but have no symptoms.  Convert to twice daily if you develop flu symptoms after a known exposure and call or a refill    Start the prednisone  and azithromycin  for COVID or any viral  URI, which is what most infections passed on a plane are from.   take an oral decongestant (sudafed PE) or use Afrin for sinus congestion and  And benadryl/Delsym for nighttime cough.  Only start the cipro  for bladder infection, or if you  are having diarrhea with fevers (100.4 or higher),   Please take a probiotic ( Align, Floraque or Culturelle), the generic version of one of these over the counter medications, or an alternative form (kombucha,  Yogurt, or another dietary source) for a minimum of 3 weeks to prevent a serious antibiotic associated diarrhea  Called clostridium dificile colitis.  Taking a probiotic may also prevent vaginitis due to yeast infections and can be continued indefinitely if you feel that it improves your digestion or your elimination (bowels)

## 2024-07-28 NOTE — Assessment & Plan Note (Signed)
 She was referred  in 2024 for  endometrial evaluation and per patient a  biopsy was reportedly normal.  .  Records requested from [facility] ob g[facility] were scanned into the chart  Oct 2024 , but not retrievable due to a formatting error

## 2024-07-28 NOTE — Assessment & Plan Note (Addendum)
 She has no symptoms of under or overactive Thyroid  on current levothyroxine  dose of 100 mcg . Repeat TSH normal   Lab Results  Component Value Date   TSH 1.89 07/28/2024

## 2024-07-30 ENCOUNTER — Ambulatory Visit: Payer: Self-pay | Admitting: Internal Medicine

## 2024-08-04 DIAGNOSIS — M1711 Unilateral primary osteoarthritis, right knee: Secondary | ICD-10-CM | POA: Diagnosis not present

## 2024-08-04 DIAGNOSIS — M1712 Unilateral primary osteoarthritis, left knee: Secondary | ICD-10-CM | POA: Diagnosis not present

## 2024-08-11 DIAGNOSIS — J209 Acute bronchitis, unspecified: Secondary | ICD-10-CM | POA: Insufficient documentation

## 2024-08-11 DIAGNOSIS — R051 Acute cough: Secondary | ICD-10-CM | POA: Insufficient documentation

## 2024-09-05 DIAGNOSIS — M0579 Rheumatoid arthritis with rheumatoid factor of multiple sites without organ or systems involvement: Secondary | ICD-10-CM | POA: Diagnosis not present

## 2024-09-05 DIAGNOSIS — M8588 Other specified disorders of bone density and structure, other site: Secondary | ICD-10-CM | POA: Diagnosis not present

## 2024-09-05 DIAGNOSIS — Z7960 Long term (current) use of unspecified immunomodulators and immunosuppressants: Secondary | ICD-10-CM | POA: Diagnosis not present

## 2024-09-05 DIAGNOSIS — M170 Bilateral primary osteoarthritis of knee: Secondary | ICD-10-CM | POA: Diagnosis not present

## 2024-09-13 ENCOUNTER — Telehealth: Payer: Self-pay

## 2024-09-13 NOTE — Telephone Encounter (Signed)
 Copied from CRM [id number]. Topic: General - Billing Inquiry >> Sep 13, 2024  3:00 PM Tysheama G wrote: Reason for CRM: Patient husband is calling regarding his wife visit on 01/27/2024. She went in for a cough and they did testing/labs etc and now they received a bill from [facility] for the amount of $1014.00 and he wants to know how and why. He stated he already contacted billing and [facility] but they stated he needs to speak with her PCP. Callback number [phone] >> Sep 13, 2024  3:05 PM Terri MATSU wrote: They been getting the runaround and they believe they need to get the code updated to something else

## 2024-09-14 ENCOUNTER — Encounter: Payer: Self-pay | Admitting: *Deleted

## 2024-09-28 ENCOUNTER — Ambulatory Visit: Admitting: Dermatology

## 2024-10-05 ENCOUNTER — Ambulatory Visit: Admitting: Dermatology

## 2024-11-04 ENCOUNTER — Telehealth: Payer: Self-pay

## 2024-11-04 NOTE — Telephone Encounter (Signed)
 Received a fax from [facility] requesting permission to change manufacture of levothyroxine  100mcg.

## 2024-11-08 NOTE — Telephone Encounter (Signed)
 Called pharmacy and left a message letting them know that Dr. Tullo has approved the change in manufacturer.

## 2025-08-04 ENCOUNTER — Encounter: Admitting: Internal Medicine
